# Patient Record
Sex: Female | Born: 1965 | Race: White | Hispanic: No | Smoking: Never smoker
Health system: Southern US, Community
[De-identification: ages and names within clinical notes are randomized; demographics above are authoritative.]

## PROBLEM LIST (undated history)

## (undated) DIAGNOSIS — Q796 Ehlers-Danlos syndrome, unspecified: Secondary | ICD-10-CM

## (undated) DIAGNOSIS — I10 Essential (primary) hypertension: Secondary | ICD-10-CM

## (undated) DIAGNOSIS — D497 Neoplasm of unspecified behavior of endocrine glands and other parts of nervous system: Secondary | ICD-10-CM

## (undated) DIAGNOSIS — M1990 Unspecified osteoarthritis, unspecified site: Secondary | ICD-10-CM

## (undated) DIAGNOSIS — R7303 Prediabetes: Secondary | ICD-10-CM

## (undated) HISTORY — PX: BUNIONECTOMY: SHX129

## (undated) HISTORY — PX: PARATHYROIDECTOMY: SHX19

## (undated) HISTORY — PX: TONSILLECTOMY: SUR1361

---

## 1995-09-21 HISTORY — PX: ABDOMINAL HYSTERECTOMY: SHX81

## 2009-12-12 DIAGNOSIS — Q2381 Bicuspid aortic valve: Secondary | ICD-10-CM | POA: Insufficient documentation

## 2009-12-12 DIAGNOSIS — Q231 Congenital insufficiency of aortic valve: Secondary | ICD-10-CM | POA: Insufficient documentation

## 2009-12-12 DIAGNOSIS — R0609 Other forms of dyspnea: Secondary | ICD-10-CM | POA: Insufficient documentation

## 2014-04-22 DIAGNOSIS — K3184 Gastroparesis: Secondary | ICD-10-CM

## 2014-04-22 DIAGNOSIS — K7581 Nonalcoholic steatohepatitis (NASH): Secondary | ICD-10-CM | POA: Insufficient documentation

## 2014-04-22 DIAGNOSIS — E1143 Type 2 diabetes mellitus with diabetic autonomic (poly)neuropathy: Secondary | ICD-10-CM | POA: Insufficient documentation

## 2014-04-22 DIAGNOSIS — Q796 Ehlers-Danlos syndrome, unspecified: Secondary | ICD-10-CM | POA: Insufficient documentation

## 2014-04-22 DIAGNOSIS — I10 Essential (primary) hypertension: Secondary | ICD-10-CM | POA: Insufficient documentation

## 2014-04-22 DIAGNOSIS — E119 Type 2 diabetes mellitus without complications: Secondary | ICD-10-CM | POA: Insufficient documentation

## 2014-04-22 DIAGNOSIS — M069 Rheumatoid arthritis, unspecified: Secondary | ICD-10-CM | POA: Insufficient documentation

## 2016-10-15 ENCOUNTER — Other Ambulatory Visit: Payer: Self-pay | Admitting: Nurse Practitioner

## 2016-10-15 DIAGNOSIS — Z1231 Encounter for screening mammogram for malignant neoplasm of breast: Secondary | ICD-10-CM

## 2017-01-24 ENCOUNTER — Encounter (HOSPITAL_COMMUNITY): Payer: Self-pay

## 2017-01-24 ENCOUNTER — Ambulatory Visit
Admission: RE | Admit: 2017-01-24 | Discharge: 2017-01-24 | Disposition: A | Discharge status: Home / Self Care | Payer: PRIVATE HEALTH INSURANCE | Source: Ambulatory Visit | Attending: Nurse Practitioner | Admitting: Nurse Practitioner

## 2017-01-24 DIAGNOSIS — Z1231 Encounter for screening mammogram for malignant neoplasm of breast: Secondary | ICD-10-CM | POA: Insufficient documentation

## 2017-01-25 ENCOUNTER — Ambulatory Visit: Payer: Self-pay | Admitting: Podiatry

## 2017-01-28 ENCOUNTER — Other Ambulatory Visit: Payer: Self-pay | Admitting: Nurse Practitioner

## 2017-01-28 DIAGNOSIS — N6489 Other specified disorders of breast: Secondary | ICD-10-CM

## 2017-01-28 DIAGNOSIS — R928 Other abnormal and inconclusive findings on diagnostic imaging of breast: Secondary | ICD-10-CM

## 2017-02-03 ENCOUNTER — Ambulatory Visit
Admission: RE | Admit: 2017-02-03 | Discharge: 2017-02-03 | Disposition: A | Discharge status: Home / Self Care | Payer: Self-pay | Source: Ambulatory Visit | Attending: Nurse Practitioner | Admitting: Nurse Practitioner

## 2017-02-03 ENCOUNTER — Ambulatory Visit
Admission: RE | Admit: 2017-02-03 | Discharge: 2017-02-03 | Disposition: A | Discharge status: Home / Self Care | Payer: PRIVATE HEALTH INSURANCE | Source: Ambulatory Visit | Attending: Nurse Practitioner | Admitting: Nurse Practitioner

## 2017-02-03 DIAGNOSIS — R928 Other abnormal and inconclusive findings on diagnostic imaging of breast: Secondary | ICD-10-CM

## 2017-02-03 DIAGNOSIS — N6489 Other specified disorders of breast: Secondary | ICD-10-CM

## 2017-04-27 ENCOUNTER — Encounter: Payer: Self-pay | Admitting: Emergency Medicine

## 2017-04-27 ENCOUNTER — Other Ambulatory Visit: Payer: Self-pay

## 2017-04-27 ENCOUNTER — Emergency Department: Payer: Self-pay

## 2017-04-27 DIAGNOSIS — R262 Difficulty in walking, not elsewhere classified: Secondary | ICD-10-CM | POA: Insufficient documentation

## 2017-04-27 DIAGNOSIS — R29810 Facial weakness: Secondary | ICD-10-CM | POA: Insufficient documentation

## 2017-04-27 DIAGNOSIS — R531 Weakness: Secondary | ICD-10-CM | POA: Insufficient documentation

## 2017-04-27 DIAGNOSIS — Z8739 Personal history of other diseases of the musculoskeletal system and connective tissue: Secondary | ICD-10-CM | POA: Insufficient documentation

## 2017-04-27 DIAGNOSIS — R202 Paresthesia of skin: Secondary | ICD-10-CM | POA: Insufficient documentation

## 2017-04-27 LAB — DIFFERENTIAL
Basophils Absolute: 0 10*3/uL (ref 0–0.1)
Basophils Relative: 0 %
EOS PCT: 0 %
Eosinophils Absolute: 0 10*3/uL (ref 0–0.7)
LYMPHS PCT: 30 %
Lymphs Abs: 2.5 10*3/uL (ref 1.0–3.6)
MONO ABS: 0.6 10*3/uL (ref 0.2–0.9)
Monocytes Relative: 7 %
NEUTROS ABS: 5.3 10*3/uL (ref 1.4–6.5)
Neutrophils Relative %: 63 %

## 2017-04-27 LAB — CBC
HEMATOCRIT: 44.1 % (ref 35.0–47.0)
Hemoglobin: 15 g/dL (ref 12.0–16.0)
MCH: 31.6 pg (ref 26.0–34.0)
MCHC: 34.1 g/dL (ref 32.0–36.0)
MCV: 92.7 fL (ref 80.0–100.0)
PLATELETS: 241 10*3/uL (ref 150–440)
RBC: 4.75 MIL/uL (ref 3.80–5.20)
RDW: 12.5 % (ref 11.5–14.5)
WBC: 8.4 10*3/uL (ref 3.6–11.0)

## 2017-04-27 LAB — COMPREHENSIVE METABOLIC PANEL
ALK PHOS: 54 U/L (ref 38–126)
ALT: 15 U/L (ref 14–54)
ANION GAP: 8 (ref 5–15)
AST: 24 U/L (ref 15–41)
Albumin: 4.6 g/dL (ref 3.5–5.0)
BUN: 27 mg/dL — ABNORMAL HIGH (ref 6–20)
CALCIUM: 9.6 mg/dL (ref 8.9–10.3)
CO2: 26 mmol/L (ref 22–32)
CREATININE: 1.01 mg/dL — AB (ref 0.44–1.00)
Chloride: 106 mmol/L (ref 101–111)
Glucose, Bld: 109 mg/dL — ABNORMAL HIGH (ref 65–99)
Potassium: 3.8 mmol/L (ref 3.5–5.1)
SODIUM: 140 mmol/L (ref 135–145)
TOTAL PROTEIN: 7.7 g/dL (ref 6.5–8.1)
Total Bilirubin: 0.8 mg/dL (ref 0.3–1.2)

## 2017-04-27 LAB — APTT: aPTT: 28 seconds (ref 24–36)

## 2017-04-27 LAB — TROPONIN I

## 2017-04-27 LAB — PROTIME-INR
INR: 0.95
PROTHROMBIN TIME: 12.7 s (ref 11.4–15.2)

## 2017-04-27 NOTE — ED Triage Notes (Signed)
Pt ambulatory to triage with steady gait, no distress noted. Pt reports having left sided facial droop x1 week ago, symptom went away within hours. Today pt reports having weakness in left and right legs and feet. Pt seen at PCP and referred to pediatrist due to feet "slapping the floor, per pt." No drift noted to arms or legs, no facial droop or slurred speech noted.

## 2017-04-27 NOTE — ED Notes (Signed)
Patient transported to CT 

## 2017-04-28 ENCOUNTER — Emergency Department
Admission: EM | Admit: 2017-04-28 | Discharge: 2017-04-28 | Disposition: A | Discharge status: Home / Self Care | Payer: Self-pay | Attending: Student in an Organized Health Care Education/Training Program | Admitting: Student in an Organized Health Care Education/Training Program

## 2017-04-28 ENCOUNTER — Emergency Department: Payer: Self-pay

## 2017-04-28 DIAGNOSIS — R202 Paresthesia of skin: Secondary | ICD-10-CM

## 2017-04-28 DIAGNOSIS — R531 Weakness: Secondary | ICD-10-CM

## 2017-04-28 LAB — VITAMIN B12: VITAMIN B 12: 425 pg/mL (ref 180–914)

## 2017-04-28 LAB — FOLATE: FOLATE: 9 ng/mL (ref >5.9)

## 2017-04-28 MED ORDER — GADOBENATE DIMEGLUMINE 529 MG/ML IV SOLN
12.0000 mL | Freq: Once | INTRAVENOUS | Status: AC | PRN
  Administered 2017-04-28: 15 mL via INTRAVENOUS

## 2017-04-28 NOTE — ED Notes (Signed)
Pt reports recent stress involving daughter that she feels could be the cause of symptoms, but is concerned about the possibility of a stroke due to information she read regarding symptoms.  Pt is A&Ox4, no difficulty with speech.  No droop noted to one side of mouth at this time.

## 2017-04-28 NOTE — Discharge Instructions (Signed)
Return immediately should you develop any worsening symptoms, weakness, fevers, or for any concerns.

## 2017-04-28 NOTE — ED Provider Notes (Signed)
[facility] Emergency Department Provider Note    First MD Initiated Contact with Patient 04/28/17 0126     (approximate)  I have reviewed the triage vital signs and the nursing notes.   HISTORY  Chief Complaint Extremity Weakness    HPI Anna Cross is a 51 y.o. female presents with chief complaint ofdifficulty walking. States that roughly a week ago while she was at home she did notice some drooping of her left face that would last for several minutes and then resolved. It only involved the lower part of her face. She thought nothing of it but then over the past day or 2 has n worsening weakness in her lower extremity right greater than left and difficulty walking. States that she's having trouble picking up her feet due to weakness. Denies any recent fevers. No recent trauma. She does not smoke. Does have a h/ OF Ehlers-Danlos syndrome.   History reviewed. No pertinent past medical history. Family History  Problem Relation Age of Onset  . Breast cancer Neg Hx    History reviewed. No pertinent surgical history. There are no active problems to display for this patient.     Prior to Admission medications   Not on File    Allergies Patient has no known allergies.    Social History Social History  Substance Use Topics  . Smoking status: Never Smoker  . Smokeless tobacco: Never Used  . Alcohol use No    Review of Systems Patient denies headaches, rhinorrhea, blurry vision, numbness, shortness of breath, chest pain, edema, cough, abdominal pain, nausea, vomiting, diarrhea, dysuria, fevers, rashes or hallucinations unless otherwise stated above in HPI. ____________________________________________   PHYSICAL EXAM:  VITAL SIGNS: Vitals:   04/27/17 2229 04/27/17 2237  BP:  (!) 181/107  Pulse: 88   Resp: 18   Temp: 98 F (36.7 C)     Constitutional: Alert and oriented. Well appearing and in no acute distress. Eyes: Conjunctivae are  normal.  Head: Atraumatic. Nose: No congestion/rhinnorhea. Mouth/Throat: Mucous membranes are moist.   Neck: No stridor. Painless ROM.  Cardiovascular: Normal rate, regular rhythm. Grossly normal heart sounds.  Good peripheral circulation. Respiratory: Normal respiratory effort.  No retractions. Lungs CTAB. Gastrointestinal: Soft and nontender. No distention. No abdominal bruits. No CVA tenderness. Genitourinary:  Musculoskeletal: No lower extremity tenderness nor edema.  No joint effusions. Neurologic:  CN- intact.  No facial droop, Normal FNF.  Normal heel to shin.  Sensation intact bilaterally. Normal speech and language. No gross focal neurologic deficits are appreciated. No gait instability but does have slight foot drag on left  Mild decrease strength with plantar flexion R>L Skin:  Skin is warm, dry and intact. No rash noted. Psychiatric: Mood and affect are normal. Speech and behavior are normal.  ____________________________________________   LABS (all labs ordered are listed, but only abnormal results are displayed)  Results for orders placed or performed during the hospital encounter of 04/28/17 (from the past 24 hour(s))  Protime-INR     Status: None   Collection Time: 04/27/17 10:30 PM  Result Value Ref Range   Prothrombin Time 12.7 11.4 - 15.2 seconds   INR 0.95   APTT     Status: None   Collection Time: 04/27/17 10:30 PM  Result Value Ref Range   aPTT 28 24 - 36 seconds  CBC     Status: None   Collection Time: 04/27/17 10:30 PM  Result Value Ref Range   WBC 8.4 3.6 - 11.0  K/uL   RBC 4.75 3.80 - 5.20 MIL/uL   Hemoglobin 15.0 12.0 - 16.0 g/dL   HCT 44.1 35.0 - 47.0 %   MCV 92.7 80.0 - 100.0 fL   MCH 31.6 26.0 - 34.0 pg   MCHC 34.1 32.0 - 36.0 g/dL   RDW 12.5 11.5 - 14.5 %   Platelets 241 150 - 440 K/uL  Differential     Status: None   Collection Time: 04/27/17 10:30 PM  Result Value Ref Range   Neutrophils Relative % 63 %   Neutro Abs 5.3 1.4 - 6.5 K/uL    Lymphocytes Relative 30 %   Lymphs Abs 2.5 1.0 - 3.6 K/uL   Monocytes Relative 7 %   Monocytes Absolute 0.6 0.2 - 0.9 K/uL   Eosinophils Relative 0 %   Eosinophils Absolute 0.0 0 - 0.7 K/uL   Basophils Relative 0 %   Basophils Absolute 0.0 0 - 0.1 K/uL  Comprehensive metabolic panel     Status: Abnormal   Collection Time: 04/27/17 10:30 PM  Result Value Ref Range   Sodium 140 135 - 145 mmol/L   Potassium 3.8 3.5 - 5.1 mmol/L   Chloride 106 101 - 111 mmol/L   CO2 26 22 - 32 mmol/L   Glucose, Bld 109 (H) 65 - 99 mg/dL   BUN 27 (H) 6 - 20 mg/dL   Creatinine, Ser 1.01 (H) 0.44 - 1.00 mg/dL   Calcium 9.6 8.9 - 10.3 mg/dL   Total Protein 7.7 6.5 - 8.1 g/dL   Albumin 4.6 3.5 - 5.0 g/dL   AST 24 15 - 41 U/L   ALT 15 14 - 54 U/L   Alkaline Phosphatase 54 38 - 126 U/L   Total Bilirubin 0.8 0.3 - 1.2 mg/dL   GFR calc non Af Amer >60 >60 mL/min   GFR calc Af Amer >60 >60 mL/min   Anion gap 8 5 - 15  Troponin I     Status: None   Collection Time: 04/27/17 10:30 PM  Result Value Ref Range   Troponin I <0.03 <0.03 ng/mL   ____________________________________________  EKG My review and personal interpretation at Time: 22"42   Indication: weakness  Rate: 80  Rhythm: sinus Axis: normal Other: normal intervals, no stemi, no depressions ____________________________________________  RADIOLOGY  I personally reviewed all radiographic images ordered to evaluate for the above acute complaints and reviewed radiology reports and findings.  These findings were personally discussed with the patient.  Please see medical record for radiology report.  ____________________________________________   PROCEDURES  Procedure(s) performed:  Procedures    Critical Care performed: no ____________________________________________   INITIAL IMPRESSION / ASSESSMENT AND PLAN / ED COURSE  Pertinent labs & imaging results that were available during my care of the patient were reviewed by me and  considered in my medical decision making (see chart for details).  DDX: cva, dehydration, acs, anemia, ms, GBS, radiculopathy  Anna Cross is a 51 y.o. who presents to the ED with above-described symptoms. On neuro exam only objective finding is some mild weakness with dorsiflexion. CT without any acute infarct, mass or bleed. Blood work is reassuring. EKG without any dysrhythmia or ischemia. Is not consistent with ACS. Troponin is negative.Patient is able to ambulate, but given her history with symptom of facial droop last week and now with reported difficulty walking, MRI ordered due to concern for possible demyelinating disease. MRI shows no evidence of CVA or MS.Based on the intermittent symptoms do not feel this  is clinically consistent with GBS. After further discussion with patient she does enhat the symptoms arose after she gt into an argument with her boyfriend and police contacted her regarding reopening unsolved case of her daughter's murder last year. At this point giv symptoms I did offer admission to the hospital for further workup with the patient has declined is prefering to be further worked up as an outpatient. I do believe that's reasonable given her symptoms.  Have discussed with the patient and available family all diagnostics and treatments performed thus far and all questions were answered to the best of my ability. The patient demonstrates understanding and agreement with plan.       ____________________________________________   FINAL CLINICAL IMPRESSION(S) / ED DIAGNOSES  Final diagnoses:  Paresthesias  Weakness      NEW MEDICATIONS STARTED DURING THIS VISIT:  New Prescriptions   No medications on file     Note:  This document was prepared using Dragon voice recognition software and may include unintentional dictation errors.    Merlyn Lot, MD 04/28/17 [phone]

## 2017-05-03 ENCOUNTER — Ambulatory Visit: Payer: Self-pay | Admitting: Podiatry

## 2017-05-03 DIAGNOSIS — E785 Hyperlipidemia, unspecified: Secondary | ICD-10-CM | POA: Insufficient documentation

## 2017-05-03 DIAGNOSIS — I7090 Unspecified atherosclerosis: Secondary | ICD-10-CM | POA: Insufficient documentation

## 2017-05-03 DIAGNOSIS — Z8249 Family history of ischemic heart disease and other diseases of the circulatory system: Secondary | ICD-10-CM | POA: Insufficient documentation

## 2017-05-11 DIAGNOSIS — R29898 Other symptoms and signs involving the musculoskeletal system: Secondary | ICD-10-CM | POA: Insufficient documentation

## 2017-05-13 ENCOUNTER — Other Ambulatory Visit: Payer: Self-pay | Admitting: Neurology

## 2017-05-13 DIAGNOSIS — G610 Guillain-Barre syndrome: Secondary | ICD-10-CM

## 2017-05-20 ENCOUNTER — Ambulatory Visit
Admission: RE | Admit: 2017-05-20 | Discharge: 2017-05-20 | Disposition: A | Discharge status: Home / Self Care | Payer: PRIVATE HEALTH INSURANCE | Source: Ambulatory Visit | Attending: Neurology | Admitting: Neurology

## 2017-05-20 DIAGNOSIS — R29898 Other symptoms and signs involving the musculoskeletal system: Secondary | ICD-10-CM | POA: Insufficient documentation

## 2017-05-20 DIAGNOSIS — G610 Guillain-Barre syndrome: Secondary | ICD-10-CM

## 2017-05-20 HISTORY — DX: Prediabetes: R73.03

## 2017-05-20 HISTORY — DX: Essential (primary) hypertension: I10

## 2017-05-20 HISTORY — DX: Ehlers-Danlos syndrome, unspecified: Q79.60

## 2017-05-20 HISTORY — DX: Neoplasm of unspecified behavior of endocrine glands and other parts of nervous system: D49.7

## 2017-05-20 HISTORY — DX: Unspecified osteoarthritis, unspecified site: M19.90

## 2017-05-20 LAB — CSF CELL COUNT WITH DIFFERENTIAL
Eosinophils, CSF: 0 %
LYMPHS CSF: 94 %
Monocyte-Macrophage-Spinal Fluid: 6 %
Other Cells, CSF: 0
RBC COUNT CSF: 67 /mm3 — AB (ref 0–3)
SEGMENTED NEUTROPHILS-CSF: 0 %
Tube #: 4
WBC, CSF: 5 /mm3 (ref 0–5)

## 2017-05-20 LAB — PROTEIN, CSF: Total  Protein, CSF: 46 mg/dL — ABNORMAL HIGH (ref 15–45)

## 2017-05-20 LAB — GLUCOSE, CSF: Glucose, CSF: 58 mg/dL (ref 40–70)

## 2017-05-20 LAB — ALBUMIN: Albumin: 4.3 g/dL (ref 3.5–5.0)

## 2017-05-20 MED ORDER — LIDOCAINE HCL (PF) 1 % IJ SOLN
2.0000 mL | Freq: Once | INTRAMUSCULAR | Status: AC
  Administered 2017-05-20: 2 mL
  Filled 2017-05-20: qty 2

## 2017-05-20 NOTE — Progress Notes (Signed)
Patient ID: Anna Cross, female   DOB: [birth year], 51 y.o.   MRN: [id number] Pt stable.back stable.f/u with her m. d.

## 2017-05-20 NOTE — Discharge Instructions (Signed)
Lumbar Puncture, Care After Refer to this sheet in the next few weeks. These instructions provide you with information on caring for yourself after your procedure. Your health care provider may also give you more specific instructions. Your treatment has been planned according to current medical practices, but problems sometimes occur. Call your health care provider if you have any problems or questions after your procedure. What can I expect after the procedure? After your procedure, it is typical to have the following sensations:  Mild discomfort or pain at the insertion site.  Mild headache that is relieved with pain medicines.  Follow these instructions at home:   Avoid lifting anything heavier than 10 lb (4.5 kg) for at least 12 hours after the procedure.  Drink enough fluids to keep your urine clear or pale yellow. Contact a health care provider if:  You have fever or chills.  You have nausea or vomiting.  You have a headache that lasts for more than 2 days. Get help right away if:  You have any numbness or tingling in your legs.  You are unable to control your bowel or bladder.  You have bleeding or swelling in your back at the insertion site.  You are dizzy or faint. This information is not intended to replace advice given to you by your health care provider. Make sure you discuss any questions you have with your health care provider. Document Released: 09/11/2013 Document Revised: 02/12/2016 Document Reviewed: 05/15/2013 Elsevier Interactive Patient Education  2017 [employer].

## 2017-05-24 LAB — IGG CSF INDEX
ALBUMIN CSF-MCNC: 33 mg/dL (ref 11–48)
Albumin: 4.6 g/dL (ref 3.5–5.5)
CSF IgG Index: 0.4 (ref 0.0–0.7)
IGG CSF: 3.6 mg/dL (ref 0.0–8.6)
IgG (Immunoglobin G), Serum: 1128 mg/dL (ref 700–1600)
IgG/Alb Ratio, CSF: 0.11 (ref 0.00–0.25)

## 2017-05-24 LAB — OLIGOCLONAL BANDS, CSF + SERM

## 2017-05-25 LAB — IGG 4: IGG 4: 13 mg/dL (ref 2–96)

## 2017-06-07 DIAGNOSIS — R5383 Other fatigue: Secondary | ICD-10-CM | POA: Insufficient documentation

## 2017-06-07 DIAGNOSIS — Q670 Congenital facial asymmetry: Secondary | ICD-10-CM | POA: Insufficient documentation

## 2017-08-17 ENCOUNTER — Other Ambulatory Visit: Payer: Self-pay | Admitting: Physical Medicine and Rehabilitation

## 2017-08-17 DIAGNOSIS — M5416 Radiculopathy, lumbar region: Secondary | ICD-10-CM

## 2017-08-24 ENCOUNTER — Ambulatory Visit
Admission: RE | Admit: 2017-08-24 | Discharge: 2017-08-24 | Disposition: A | Discharge status: Home / Self Care | Payer: BLUE CROSS/BLUE SHIELD | Source: Ambulatory Visit | Attending: Physical Medicine and Rehabilitation | Admitting: Physical Medicine and Rehabilitation

## 2017-08-24 DIAGNOSIS — M5126 Other intervertebral disc displacement, lumbar region: Secondary | ICD-10-CM | POA: Insufficient documentation

## 2017-08-24 DIAGNOSIS — M5416 Radiculopathy, lumbar region: Secondary | ICD-10-CM | POA: Diagnosis present

## 2017-08-24 DIAGNOSIS — M48061 Spinal stenosis, lumbar region without neurogenic claudication: Secondary | ICD-10-CM | POA: Insufficient documentation

## 2018-01-16 ENCOUNTER — Emergency Department: Payer: BLUE CROSS/BLUE SHIELD

## 2018-01-16 ENCOUNTER — Other Ambulatory Visit: Payer: Self-pay

## 2018-01-16 ENCOUNTER — Emergency Department
Admission: EM | Admit: 2018-01-16 | Discharge: 2018-01-16 | Disposition: A | Discharge status: Home / Self Care | Payer: BLUE CROSS/BLUE SHIELD | Attending: Emergency Medicine | Admitting: Emergency Medicine

## 2018-01-16 ENCOUNTER — Encounter: Payer: Self-pay | Admitting: Emergency Medicine

## 2018-01-16 DIAGNOSIS — L03116 Cellulitis of left lower limb: Secondary | ICD-10-CM | POA: Insufficient documentation

## 2018-01-16 DIAGNOSIS — S93402A Sprain of unspecified ligament of left ankle, initial encounter: Secondary | ICD-10-CM

## 2018-01-16 DIAGNOSIS — W208XXA Other cause of strike by thrown, projected or falling object, initial encounter: Secondary | ICD-10-CM | POA: Insufficient documentation

## 2018-01-16 DIAGNOSIS — I10 Essential (primary) hypertension: Secondary | ICD-10-CM | POA: Insufficient documentation

## 2018-01-16 DIAGNOSIS — Y929 Unspecified place or not applicable: Secondary | ICD-10-CM | POA: Diagnosis not present

## 2018-01-16 DIAGNOSIS — Y939 Activity, unspecified: Secondary | ICD-10-CM | POA: Insufficient documentation

## 2018-01-16 DIAGNOSIS — E119 Type 2 diabetes mellitus without complications: Secondary | ICD-10-CM | POA: Diagnosis not present

## 2018-01-16 DIAGNOSIS — Z7984 Long term (current) use of oral hypoglycemic drugs: Secondary | ICD-10-CM | POA: Insufficient documentation

## 2018-01-16 DIAGNOSIS — Y999 Unspecified external cause status: Secondary | ICD-10-CM | POA: Insufficient documentation

## 2018-01-16 DIAGNOSIS — S99912A Unspecified injury of left ankle, initial encounter: Secondary | ICD-10-CM | POA: Diagnosis present

## 2018-01-16 MED ORDER — HYDROCODONE-ACETAMINOPHEN 5-325 MG PO TABS: 1.0000 | ORAL_TABLET | Freq: Four times a day (QID) | ORAL | 0 refills | Status: DC | PRN

## 2018-01-16 MED ORDER — CLINDAMYCIN HCL 300 MG PO CAPS: 300.0000 mg | ORAL_CAPSULE | Freq: Three times a day (TID) | ORAL | 0 refills | Status: DC

## 2018-01-16 NOTE — ED Provider Notes (Signed)
[name] Emergency Department Provider Note  ____________________________________________   First MD Initiated Contact with Patient 01/16/18 1155     (approximate)  I have reviewed the triage vital signs and the nursing notes.   HISTORY  Chief Complaint Ankle Pain    HPI Anna Cross is a 52 y.o. female presents emergency department complaining of left ankle pain that started yesterday after a large tree limb hit her in the ankle.  She states it was mildly swollen yesterday but is gotten worse today.  She is concerned about the redness and the warmth that is involved.  She was to make sure she does not have a fracture.  She denies any other issues at this time.  Past Medical History:  Diagnosis Date  . Arthritis   . Ehlers-Danlos disease   . Hypertension   . Parathyroid tumor   . Pre-diabetes     Patient Active Problem List   Diagnosis Date Noted  . Atherosclerosis 05/03/2017  . Dyslipidemia 05/03/2017  . FH: heart disease 05/03/2017  . DM (diabetes mellitus) ([place]) 04/22/2014  . Ehlers-Danlos syndrome 04/22/2014  . Gastroparesis due to DM ([place]) 04/22/2014  . HTN (hypertension) 04/22/2014  . NASH (nonalcoholic steatohepatitis) 04/22/2014  . Rheumatoid arthritis ([place]) 04/22/2014  . Bicuspid aortic valve 12/12/2009  . Other dyspnea and respiratory abnormality 12/12/2009    Past Surgical History:  Procedure Laterality Date  . ABDOMINAL HYSTERECTOMY    . BUNIONECTOMY    . PARATHYROIDECTOMY    . TONSILLECTOMY      Prior to Admission medications   Medication Sig Start Date End Date Taking? Authorizing Provider  amitriptyline (ELAVIL) 75 MG tablet Take by mouth.    [provider]  clindamycin (CLEOCIN) 300 MG capsule Take 1 capsule (300 mg total) by mouth 3 (three) times daily. 01/16/18   Carmine Carrozza, Linden Dolin, PA-C  etanercept (ENBREL) 50 MG/ML injection Inject into the skin. 12/12/09   [provider]  glipiZIDE (GLUCOTROL) 5  MG tablet Take by mouth.    [provider]  HYDROcodone-acetaminophen (NORCO/VICODIN) 5-325 MG tablet Take 1 tablet by mouth every 6 (six) hours as needed for moderate pain. 01/16/18   Gibril Mastro, Linden Dolin, PA-C  lisinopril (PRINIVIL,ZESTRIL) 10 MG tablet TAKE ONE TABLET BY MOUTH ONE TIME DAILY 11/04/11   [provider]  metFORMIN (GLUCOPHAGE) 1000 MG tablet Take by mouth.    [provider]  metoprolol succinate (TOPROL-XL) 50 MG 24 hr tablet TAKE ONE AND ONE-HALF TABLETS BY MOUTH ONCE DAILY 08/21/11   [provider]  pantoprazole (PROTONIX) 40 MG tablet Take by mouth.    [provider]  riTUXimab [phone] (RITUXAN) 10 MG/ML injection Inject into the vein.    [provider]  rosuvastatin (CRESTOR) 5 MG tablet TAKE ONE TABLET BY MOUTH AT BEDTIME 07/19/11   [provider]  traZODone (DESYREL) 100 MG tablet Take 100 mg by mouth at bedtime.    [provider]  vitamin E 400 UNIT capsule Take by mouth.    [provider]    Allergies Penicillins  Family History  Problem Relation Age of Onset  . Breast cancer Neg Hx     Social History Social History   Tobacco Use  . Smoking status: Never Smoker  . Smokeless tobacco: Never Used  Substance Use Topics  . Alcohol use: No  . Drug use: No    Review of Systems  Constitutional: No fever/chills Eyes: No visual changes. ENT: No sore throat. Respiratory:  Denies cough Genitourinary: Negative for dysuria. Musculoskeletal: Negative for back pain.  Positive for left ankle pain Skin: Negative for rash.    ____________________________________________   PHYSICAL EXAM:  VITAL SIGNS: ED Triage Vitals  Enc Vitals Group     BP 01/16/18 1124 (!) 141/91     Pulse Rate 01/16/18 1124 62     Resp 01/16/18 1124 17     Temp 01/16/18 1124 97.6 F (36.4 C)     Temp Source 01/16/18 1124 Oral     SpO2 01/16/18 1124 100 %     Weight 01/16/18 1122 138 lb (62.6 kg)      Height 01/16/18 1122 5' 4"  (1.626 m)     Head Circumference --      Peak Flow --      Pain Score 01/16/18 1122 6     Pain Loc --      Pain Edu? --      Excl. in [place]? --     Constitutional: Alert and oriented. Well appearing and in no acute distress. Eyes: Conjunctivae are normal.  Head: Atraumatic. Nose: No congestion/rhinnorhea. Mouth/Throat: Mucous membranes are moist.   Cardiovascular: Normal rate, regular rhythm. Respiratory: Normal respiratory effort.  No retractions GU: deferred Musculoskeletal: FROM all extremities, warm and well perfused.  The left ankle has some redness and swelling.  The left foot is tender to palpation.  She is neurovascularly intact. Neurologic:  Normal speech and language.  Skin:  Skin is warm, dry and intact. No rash noted. Psychiatric: Mood and affect are normal. Speech and behavior are normal.  ____________________________________________   LABS (all labs ordered are listed, but only abnormal results are displayed)  Labs Reviewed - No data to display ____________________________________________   ____________________________________________  RADIOLOGY  Tray of the left ankle is negative for any acute fracture.  ____________________________________________   PROCEDURES  Procedure(s) performed: Ace wrap stirrup splint were applied by tech  Procedures    ____________________________________________   INITIAL IMPRESSION / ASSESSMENT AND PLAN / ED COURSE  Pertinent labs & imaging results that were available during my care of the patient were reviewed by me and considered in my medical decision making (see chart for details).  Patient is 52 year old female presented emergency department after a injury to the left ankle yesterday.  States that ankle was hit by a large tree limb.  She states area has been red and swollen.  On physical exam the left ankle is tender to palpation.  There is a large amount of cellulitis surrounding the  ankle and a small abrasion.  X-ray of the left ankle is negative for any acute fractures.  Results were discussed with patient.  She was given an Ace wrap and stirrup splint.  She was given an antibiotic to prevent infection.  She is to follow-up with her regular doctor or orthopedics if not better in 5 to 7 days.  If the redness is worsening she should return to the emergency department for further evaluation.  She states she understands will comply with her treatment plan.  She was discharged in stable condition     As part of my medical decision making, I reviewed the following data within the [place] notes reviewed and incorporated, Old chart reviewed, Radiograph reviewed x-ray left ankle is negative for fracture, Notes from prior ED visits and [place] Controlled Substance Database  ____________________________________________   FINAL CLINICAL IMPRESSION(S) / ED DIAGNOSES  Final diagnoses:  Sprain of left ankle, unspecified ligament, initial encounter  Cellulitis of left lower extremity      NEW MEDICATIONS STARTED DURING THIS VISIT:  Discharge Medication List as of 01/16/2018 12:22 PM    START taking these medications   Details  clindamycin (CLEOCIN) 300 MG capsule Take 1 capsule (300 mg total) by mouth 3 (three) times daily., Starting Mon 01/16/2018, Print         Note:  This document was prepared using Dragon voice recognition software and may include unintentional dictation errors.    Versie Starks, PA-C 01/16/18 1401    Lavonia Drafts, MD 01/16/18 1438

## 2018-01-16 NOTE — ED Triage Notes (Signed)
Injured left ankle yesterday.  While cutting a tree, a limb of tree swung back and hit ankle.  C/O pain.

## 2018-01-16 NOTE — ED Notes (Signed)
See triage note   States she was hit to left lower leg/ankle area yesterday by large tree limb  Positive swelling with some redness   Good pulses

## 2018-01-16 NOTE — Discharge Instructions (Addendum)
With your regular doctor if not better in 3 to 5 days.  If the redness and swelling is increasing please return emergency department.

## 2018-10-09 ENCOUNTER — Encounter: Payer: Self-pay | Admitting: Family Medicine

## 2018-10-09 ENCOUNTER — Ambulatory Visit: Payer: PRIVATE HEALTH INSURANCE | Admitting: Family Medicine

## 2018-10-09 VITALS — BP 172/88 | HR 95 | Temp 97.7°F | Resp 16 | Ht 64.0 in | Wt 154.0 lb

## 2018-10-09 DIAGNOSIS — Q231 Congenital insufficiency of aortic valve: Secondary | ICD-10-CM

## 2018-10-09 DIAGNOSIS — Z23 Encounter for immunization: Secondary | ICD-10-CM

## 2018-10-09 DIAGNOSIS — K3184 Gastroparesis: Secondary | ICD-10-CM

## 2018-10-09 DIAGNOSIS — E1143 Type 2 diabetes mellitus with diabetic autonomic (poly)neuropathy: Secondary | ICD-10-CM | POA: Diagnosis not present

## 2018-10-09 DIAGNOSIS — E1169 Type 2 diabetes mellitus with other specified complication: Secondary | ICD-10-CM

## 2018-10-09 DIAGNOSIS — Q796 Ehlers-Danlos syndrome, unspecified: Secondary | ICD-10-CM | POA: Diagnosis not present

## 2018-10-09 DIAGNOSIS — I7090 Unspecified atherosclerosis: Secondary | ICD-10-CM

## 2018-10-09 DIAGNOSIS — K7581 Nonalcoholic steatohepatitis (NASH): Secondary | ICD-10-CM

## 2018-10-09 DIAGNOSIS — Z8249 Family history of ischemic heart disease and other diseases of the circulatory system: Secondary | ICD-10-CM

## 2018-10-09 DIAGNOSIS — Z114 Encounter for screening for human immunodeficiency virus [HIV]: Secondary | ICD-10-CM

## 2018-10-09 DIAGNOSIS — M069 Rheumatoid arthritis, unspecified: Secondary | ICD-10-CM | POA: Diagnosis not present

## 2018-10-09 DIAGNOSIS — F4310 Post-traumatic stress disorder, unspecified: Secondary | ICD-10-CM

## 2018-10-09 DIAGNOSIS — Z1159 Encounter for screening for other viral diseases: Secondary | ICD-10-CM

## 2018-10-09 DIAGNOSIS — I10 Essential (primary) hypertension: Secondary | ICD-10-CM

## 2018-10-09 DIAGNOSIS — E785 Hyperlipidemia, unspecified: Secondary | ICD-10-CM

## 2018-10-09 MED ORDER — ESCITALOPRAM OXALATE 5 MG PO TABS: 5.0000 mg | ORAL_TABLET | Freq: Every day | ORAL | 1 refills | Status: DC

## 2018-10-09 MED ORDER — LISINOPRIL 20 MG PO TABS: 20.0000 mg | ORAL_TABLET | Freq: Every day | ORAL | 0 refills | Status: DC

## 2018-10-09 NOTE — Progress Notes (Signed)
Name: Anna Cross   MRN: [id number]    DOB: 12/28/1965   Date:10/09/2018       Progress Note  Subjective  Chief Complaint  Chief Complaint  Patient presents with  . Establish Care  . Hypertension  . Diabetes    Diet controlled    HPI  Pt presents to establish care and for the following:  Social: She was out of insurance for several months when switching jobs.  She stopped all medications except for lisinopril.   Ehlers-Danlos: She was diagnosed along with her 3 children after her daugher was having repetitive dislocations.  Daughter was diagnosed at [facility]. 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  She would like to be connected with a specialist - has seen neurologist with [facility] for nerve conduction studies in the past, but this was not sub-specialist.  She does not hx "compressed disc".  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  Does endorse ongoing physical pain, has hx diagnosis of arthritis - thinks it was RA, but wonders if also related to Ehlers-Danlos.  She did do a lot of RA treatment for several years and none of it helped. Records review shows that she was seeing Dr. Melrose Nakayama in the past.   DM: Has been diet controlled.  Has history of gastroparesis and NAFLD. Does not eat processed foods, walks at work, wants to get into something more intense if she can find time. She has been out of her lisinopril for several months. Not taking Crestor  HTN: She has hx HTN, out of lisinopril for several months.  She did have BP checked by her nurse at work last week and was elevated at 180/90's.  Today she is also elevated.  Was on 80m, but we will increase to 262mLisinopril today.  She does not want to come back in 2 weeks for BP check - will message BP after she has the nurse at work check it.  She denies abnormal headaches, vision changes, condusion, slurred speech, extremity weakness, facial droop.   HLD/Heart Disease/Atherosclerosis/Bicuspid Aortic  Valve: Mom died at age 889yorom MI; Dad had heart disease, sister passed away from cirrhosis (ETOH Abuse).  Has not been taking crestor.  She was having palpitations in the past and had echo done - found bicuspid aortic valve - grandmother also had it.  We will check labs. She denies chest pain, shortness of breath, or palpitations.   PTSD/Depression: Daughter died in 2003-07-2016 She has tried counseling in the past and did not work well for her.  Has trouble sleeping - trazodone made her too groggy.  The anniversary of her daughter's death is upcoming in Fe03-06-2023 She notes some lack of motivation and feeling down lately.  We discussed medication options at length - we will trial low dose Lexapro. Denies SI/HI  HM: No longer obtaining pap smears - had cervix removed with hysterectomy.   Patient Active Problem List   Diagnosis Date Noted  . Atherosclerosis 05/03/2017  . Dyslipidemia 05/03/2017  . FH: heart disease 05/03/2017  . DM (diabetes mellitus) ([place]08/11/2013  . Ehlers-Danlos syndrome 04/22/2014  . Gastroparesis due to DM (HCCamdenton08/11/2013  . HTN (hypertension) 04/22/2014  . NASH (nonalcoholic steatohepatitis) 04/22/2014  . Rheumatoid arthritis (HCMan[place]08/11/2013  . Bicuspid aortic valve 12/12/2009  . Other dyspnea and respiratory abnormality 12/12/2009    Past Surgical History:  Procedure Laterality Date  . ABDOMINAL HYSTERECTOMY  1997   Partial  .  BUNIONECTOMY    . PARATHYROIDECTOMY    . TONSILLECTOMY      Family History  Problem Relation Age of Onset  . Heart attack Mother   . Heart disease Father   . Diabetes Mellitus II Sister   . Liver disease Sister   . Hypertension Brother   . Ehlers-Danlos syndrome Daughter   . Ehlers-Danlos syndrome Daughter   . Ehlers-Danlos syndrome Daughter   . Breast cancer Neg Hx     Social History   Socioeconomic History  . Marital status: Divorced    Spouse name: Not on file  . Number of children: 4  . Years of education: Not on  file  . Highest education level: Some college, no degree  Occupational History  . Not on file  Social Needs  . Financial resource strain: Not hard at all  . Food insecurity:    Worry: Never true    Inability: Never true  . Transportation needs:    Medical: No    Non-medical: No  Tobacco Use  . Smoking status: Never Smoker  . Smokeless tobacco: Never Used  Substance and Sexual Activity  . Alcohol use: Yes    Alcohol/week: 2.0 standard drinks    Types: 2 Cans of beer per week  . Drug use: No  . Sexual activity: Yes    Partners: Male    Birth control/protection: Surgical  Lifestyle  . Physical activity:    Days per week: 6 days    Minutes per session: 30 min  . Stress: Very much  Relationships  . Social connections:    Talks on phone: More than three times a week    Gets together: Once a week    Attends religious service: Never    Active member of club or organization: No    Attends meetings of clubs or organizations: Never    Relationship status: Divorced  . Intimate partner violence:    Fear of current or ex partner: No    Emotionally abused: No    Physically abused: No    Forced sexual activity: No  Other Topics Concern  . Not on file  Social History Narrative  . Not on file     Current Outpatient Medications:  .  lisinopril (PRINIVIL,ZESTRIL) 10 MG tablet, TAKE ONE TABLET BY MOUTH ONE TIME DAILY, Disp: , Rfl:  .  amitriptyline (ELAVIL) 75 MG tablet, Take by mouth., Disp: , Rfl:  .  clindamycin (CLEOCIN) 300 MG capsule, Take 1 capsule (300 mg total) by mouth 3 (three) times daily. (Patient not taking: Reported on 10/09/2018), Disp: 21 capsule, Rfl: 0 .  etanercept (ENBREL) 50 MG/ML injection, Inject into the skin., Disp: , Rfl:  .  glipiZIDE (GLUCOTROL) 5 MG tablet, Take by mouth., Disp: , Rfl:  .  HYDROcodone-acetaminophen (NORCO/VICODIN) 5-325 MG tablet, Take 1 tablet by mouth every 6 (six) hours as needed for moderate pain. (Patient not taking: Reported on  10/09/2018), Disp: 15 tablet, Rfl: 0 .  metFORMIN (GLUCOPHAGE) 1000 MG tablet, Take by mouth., Disp: , Rfl:  .  metoprolol succinate (TOPROL-XL) 50 MG 24 hr tablet, TAKE ONE AND ONE-HALF TABLETS BY MOUTH ONCE DAILY, Disp: , Rfl:  .  pantoprazole (PROTONIX) 40 MG tablet, Take by mouth., Disp: , Rfl:  .  riTUXimab D9242 (RITUXAN) 10 MG/ML injection, Inject into the vein., Disp: , Rfl:  .  rosuvastatin (CRESTOR) 5 MG tablet, TAKE ONE TABLET BY MOUTH AT BEDTIME, Disp: , Rfl:  .  traZODone (DESYREL) 100 MG  tablet, Take 100 mg by mouth at bedtime., Disp: , Rfl:  .  vitamin E 400 UNIT capsule, Take by mouth., Disp: , Rfl:   Allergies  Allergen Reactions  . Penicillins Hives    I personally reviewed active problem list, medication list, allergies, family history, social history, health maintenance, notes from last encounter, lab results with the patient/caregiver today.   ROS Constitutional: Negative for fever or weight change.  Respiratory: Negative for cough and shortness of breath.   Cardiovascular: Negative for chest pain or palpitations.  Gastrointestinal: Negative for abdominal pain, no bowel changes.  Musculoskeletal: Negative for gait problem or joint swelling.  Skin: Negative for rash.  Neurological: Negative for dizziness or headache.  No other specific complaints in a complete review of systems (except as listed in HPI above).  Objective  Vitals:   10/09/18 1018  BP: (!) 172/88  Pulse: 95  Resp: 16  Temp: 97.7 F (36.5 C)  TempSrc: Oral  SpO2: 97%  Weight: 154 lb (69.9 kg)  Height: 5' 4"  (1.626 m)   Body mass index is 26.43 kg/m.  Physical Exam  Constitutional: Patient appears well-developed and well-nourished. No distress.  HENT: Head: Normocephalic and atraumatic. Eyes: Conjunctivae and EOM are normal. No scleral icterus. Neck: Normal range of motion. Neck supple. No JVD present. No thyromegaly present.  Cardiovascular: Normal rate, regular rhythm and normal  heart sounds.  No murmur heard. No BLE edema. Pulmonary/Chest: Effort normal and breath sounds normal. No respiratory distress. Abdominal: Soft. Bowel sounds are normal, no distension. There is no tenderness. No masses. Musculoskeletal: Normal range of motion, no joint effusions. No gross deformities Neurological: Pt is alert and oriented to person, place, and time. No cranial nerve deficit. Coordination, balance, strength, speech and gait are normal.  Skin: Skin is warm and dry. No rash noted. No erythema.  Psychiatric: Patient has a normal mood and affect. behavior is normal. Judgment and thought content normal.   No results found for this or any previous visit (from the past 72 hour(s)).   PHQ2/9: Depression screen PHQ 2/9 10/09/2018  Decreased Interest 0  Down, Depressed, Hopeless 0  PHQ - 2 Score 0  Altered sleeping 2  Tired, decreased energy 1  Change in appetite 0  Feeling bad or failure about yourself  0  Trouble concentrating 0  Moving slowly or fidgety/restless 0  Suicidal thoughts 0  PHQ-9 Score 3  Difficult doing work/chores Not difficult at all   Fall Risk: Fall Risk  10/09/2018  Falls in the past year? 0   Functional Status Survey: Is the patient deaf or have difficulty hearing?: No Does the patient have difficulty seeing, even when wearing glasses/contacts?: Yes Does the patient have difficulty concentrating, remembering, or making decisions?: No Does the patient have difficulty walking or climbing stairs?: No Does the patient have difficulty dressing or bathing?: No Does the patient have difficulty doing errands alone such as visiting a doctor's office or shopping?: No  Assessment & Plan 1. Ehlers-Danlos syndrome - We will work to find rheumatologist with subspecialty of Ehlers-Danlos.  2. Rheumatoid arthritis, involving unspecified site, unspecified rheumatoid factor presence ([place])- - We will work to find rheumatologist with subspecialty of  Ehlers-Danlos.  3. Type 2 diabetes mellitus with other specified complication, without long-term current use of insulin (HCC) - lisinopril (PRINIVIL,ZESTRIL) 20 MG tablet; Take 1 tablet (20 mg total) by mouth daily.  Dispense: 90 tablet; Refill: 0 - Lipid panel - Hemoglobin A1c - COMPLETE METABOLIC PANEL WITH GFR -  Urine Microalbumin w/creat. ratio - Pneumococcal polysaccharide vaccine 23-valent greater than or equal to 2yo subcutaneous/IM  4. Gastroparesis due to DM (HCC) - No concerns today  5. Essential hypertension - DASH diet discussed - lisinopril (PRINIVIL,ZESTRIL) 20 MG tablet; Take 1 tablet (20 mg total) by mouth daily.  Dispense: 90 tablet; Refill: 0  6. Dyslipidemia - Lipid panel  7. Atherosclerosis - Lipid Panel  8. Bicuspid aortic valve - Stable  9. NASH (nonalcoholic steatohepatitis) - CMP  10. FH: heart disease  11. PTSD (post-traumatic stress disorder) - Lexapro per orders  12. Encounter for screening for HIV - HIV Antibody (routine testing w rflx)  13. Need for hepatitis C screening test - Hepatitis C antibody

## 2018-10-09 NOTE — Patient Instructions (Addendum)
   [email or web address] therapist finder  Here are some resources to help you if you feel you are in a mental health crisis:  [place] - Call [phone]  for help - Website with more resources: [email or web address].pt  [facility] [facility] - Call [phone] for help. - Mobile Crisis Program available 24 hours a day, 365 days a year. - Available for anyone of any age in [place] counties.  [facility] - Address: [address], [place] - Telephone: [phone]  - Hours of Operation: Sunday - Saturday - 8:00 a.m. - 8:00 p.m. - Medicaid, Medicare (Government Issued Only), BCBS, and [facility] [place] [facility], [place], Psychiatrists on-site to provide medication management, [place], and Peer Support Care.  [facility] - Call [phone] for help. - Mobile Crisis Program available 24 hours a day, 365 days a year. - Available for anyone of any age in [place]

## 2018-10-10 LAB — COMPLETE METABOLIC PANEL WITH GFR
AG RATIO: 1.8 (calc) (ref 1.0–2.5)
ALT: 13 U/L (ref 6–29)
AST: 19 U/L (ref 10–35)
Albumin: 4.7 g/dL (ref 3.6–5.1)
Alkaline phosphatase (APISO): 86 U/L (ref 33–130)
BUN: 20 mg/dL (ref 7–25)
CALCIUM: 9.7 mg/dL (ref 8.6–10.4)
CO2: 29 mmol/L (ref 20–32)
Chloride: 103 mmol/L (ref 98–110)
Creat: 0.87 mg/dL (ref 0.50–1.05)
GFR, EST NON AFRICAN AMERICAN: 76 mL/min/{1.73_m2} (ref >=60)
GFR, Est African American: 88 mL/min/{1.73_m2} (ref >=60)
GLOBULIN: 2.6 g/dL (ref 1.9–3.7)
Glucose, Bld: 94 mg/dL (ref 65–139)
POTASSIUM: 4.3 mmol/L (ref 3.5–5.3)
Sodium: 141 mmol/L (ref 135–146)
TOTAL PROTEIN: 7.3 g/dL (ref 6.1–8.1)
Total Bilirubin: 0.8 mg/dL (ref 0.2–1.2)

## 2018-10-10 LAB — LIPID PANEL
CHOLESTEROL: 213 mg/dL — AB (ref <200)
HDL: 59 mg/dL (ref >50)
LDL Cholesterol (Calc): 128 mg/dL (calc) — ABNORMAL HIGH
NON-HDL CHOLESTEROL (CALC): 154 mg/dL — AB (ref <130)
Total CHOL/HDL Ratio: 3.6 (calc) (ref <5.0)
Triglycerides: 148 mg/dL (ref <150)

## 2018-10-10 LAB — HEPATITIS C ANTIBODY
Hepatitis C Ab: NONREACTIVE
SIGNAL TO CUT-OFF: 0.02 (ref <1.00)

## 2018-10-10 LAB — MICROALBUMIN / CREATININE URINE RATIO
Creatinine, Urine: 204 mg/dL (ref 20–275)
MICROALB/CREAT RATIO: 29 ug/mg{creat} (ref <30)
Microalb, Ur: 6 mg/dL

## 2018-10-10 LAB — HIV ANTIBODY (ROUTINE TESTING W REFLEX): HIV 1&2 Ab, 4th Generation: NONREACTIVE

## 2018-10-11 ENCOUNTER — Other Ambulatory Visit: Payer: Self-pay | Admitting: Family Medicine

## 2018-10-11 MED ORDER — ROSUVASTATIN CALCIUM 10 MG PO TABS: 10.0000 mg | ORAL_TABLET | Freq: Every day | ORAL | 1 refills | Status: DC

## 2018-10-12 ENCOUNTER — Other Ambulatory Visit: Payer: Self-pay | Admitting: Family Medicine

## 2018-10-12 ENCOUNTER — Encounter: Payer: Self-pay | Admitting: Family Medicine

## 2018-10-16 ENCOUNTER — Encounter: Payer: Self-pay | Admitting: Family Medicine

## 2018-10-16 DIAGNOSIS — Z8619 Personal history of other infectious and parasitic diseases: Secondary | ICD-10-CM | POA: Insufficient documentation

## 2018-10-16 DIAGNOSIS — L409 Psoriasis, unspecified: Secondary | ICD-10-CM | POA: Insufficient documentation

## 2018-10-16 DIAGNOSIS — D351 Benign neoplasm of parathyroid gland: Secondary | ICD-10-CM | POA: Insufficient documentation

## 2018-10-18 ENCOUNTER — Encounter: Payer: Self-pay | Admitting: Family Medicine

## 2018-10-19 ENCOUNTER — Encounter: Payer: Self-pay | Admitting: Family Medicine

## 2018-10-19 ENCOUNTER — Telehealth: Payer: Self-pay

## 2018-10-19 DIAGNOSIS — I10 Essential (primary) hypertension: Secondary | ICD-10-CM

## 2018-10-19 LAB — HEMOGLOBIN A1C: Hemoglobin A1C: 4.9

## 2018-10-19 LAB — BASIC METABOLIC PANEL: Glucose: 113

## 2018-10-19 NOTE — Telephone Encounter (Signed)
Has she been compliant with her lisinopril over the last 2 weeks? Any missed doses?

## 2018-10-19 NOTE — Telephone Encounter (Signed)
Patient called to let us know a nurse came by her work and checked her A1C-4.9 and glucose was 113, her BP was 168/98. She states she has not been sleeping well and just a started her on Lisinopril 20 mg about 2 weeks ago. Anna Cross told the patient she wanted her to call with her BP results before coming back in.

## 2018-10-20 NOTE — Telephone Encounter (Signed)
Will route to office for final disposition; pt last seen 10/09/2018 by Raelyn Ensign.

## 2018-10-20 NOTE — Telephone Encounter (Signed)
° ° ° °  Pt return Anna Cross call to say she has been taking her Lisinopril and has not missed any doses

## 2018-10-20 NOTE — Telephone Encounter (Signed)
Left voicemail and asked patient to call us back with information.

## 2018-10-25 MED ORDER — LISINOPRIL 40 MG PO TABS: 40.0000 mg | ORAL_TABLET | Freq: Every day | ORAL | 1 refills | Status: DC

## 2018-10-25 NOTE — Telephone Encounter (Addendum)
Let's increase her dosing to 72m of the lisinopril -  She can take 2 tablets of her 275mlisinopril until she runs out, the start taking 1 tablet once daily of 4015misinopril tablets. She needs to come in for follow up in 2-3 weeks - I believe she has something scheduled on 11/06/18  **Please also let her know that I am still waiting on [facility]'s physician liaison [facility].**

## 2018-10-25 NOTE — Addendum Note (Signed)
Addended by: Hubbard Hartshorn on: 10/25/2018 01:53 PM   Modules accepted: Orders

## 2018-10-27 NOTE — Telephone Encounter (Signed)
Patient notified

## 2018-11-02 ENCOUNTER — Telehealth: Payer: Self-pay | Admitting: Family Medicine

## 2018-11-02 NOTE — Telephone Encounter (Signed)
Please call Anna Cross and let her know that I did submit a referral to the [facility] with [facility], however we received an e-mail today that they are booked out for at least a year for new patients. They recommend referral to pain management with severe pain, and PT/OT if needed for strengthening.  Would she like a referral to either of these services?  Please also ask if she has family history of personal history of vascular EDS or just the hypermobility form.  Thanks!

## 2018-11-02 NOTE — Telephone Encounter (Signed)
Left message for patient to call office.  

## 2018-11-06 ENCOUNTER — Encounter: Payer: Self-pay | Admitting: Family Medicine

## 2018-11-06 ENCOUNTER — Ambulatory Visit: Payer: PRIVATE HEALTH INSURANCE | Admitting: Family Medicine

## 2018-11-06 ENCOUNTER — Other Ambulatory Visit: Payer: Self-pay

## 2018-11-06 VITALS — BP 138/88 | HR 95 | Temp 98.3°F | Resp 16 | Ht 64.0 in | Wt 163.9 lb

## 2018-11-06 DIAGNOSIS — M069 Rheumatoid arthritis, unspecified: Secondary | ICD-10-CM | POA: Diagnosis not present

## 2018-11-06 DIAGNOSIS — Q796 Ehlers-Danlos syndrome, unspecified: Secondary | ICD-10-CM

## 2018-11-06 DIAGNOSIS — F4310 Post-traumatic stress disorder, unspecified: Secondary | ICD-10-CM

## 2018-11-06 DIAGNOSIS — I10 Essential (primary) hypertension: Secondary | ICD-10-CM

## 2018-11-06 MED ORDER — ESCITALOPRAM OXALATE 10 MG PO TABS: 10.0000 mg | ORAL_TABLET | Freq: Every day | ORAL | 0 refills | Status: DC

## 2018-11-06 MED ORDER — HYDROCHLOROTHIAZIDE 12.5 MG PO TABS: 12.5000 mg | ORAL_TABLET | Freq: Every day | ORAL | 3 refills | Status: AC

## 2018-11-06 NOTE — Progress Notes (Signed)
Name: Anna Cross   MRN: [id number]    DOB: 04/07/1966   Date:11/06/2018       Progress Note  Subjective  Chief Complaint  Chief Complaint  Patient presents with  . Hypertension    1 month recheck after increase in medication    HPI  Pt presents for 1 month follow up:  Ehlers-Danlos vs RA: She was diagnosed along with her 3 children after her daugher was having repetitive dislocations.  Daughter was diagnosed at [facility]. 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  She would like to be connected with a specialist - has seen neurologist with [facility] for nerve conduction studies in the past, but this was not sub-specialist.  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  Does endorse ongoing physical pain, has hx diagnosis of arthritis - thinks it was RA, but wonders if also related to Ehlers-Danlos.  She did do a lot of RA treatment for several years and none of it helped. Records review shows that she was seeing Dr. Melrose Nakayama in the past.  We did refer to ED clinic with [facility], but they are booked out over a year.  No hx vascular ED, just hypermobility.  We will refer to rheum today to allow her to be seen faster, and for eval of possible RA.   HTN: Restarted lisinopril at 69m last visit - Nurse checked BP at work and it was in the 160's about a week ago, today she is high end of normal, so we will add HCTZ 12.5 and recheck labs in 2 weeks.  Does follow low sodium diet.  She denies abnormal headaches, vision changes, confusion, slurred speech, extremity weakness, chest pain, or shortness of breath, or BLE edema.  PTSD/Depression: Daughter died in F09-Mar-2017  She has tried counseling in the past and did not work well for her.  Has trouble sleeping - trazodone made her too groggy.   She notes some lack of motivation and feeling down which has improved with Lexapro. Denies SI/HI.  She is doing well on 554m but  thinks a slightly higher dose would benefit more.  We will increase to 1030m Patient Active Problem List   Diagnosis Date Noted  . Psoriasis 10/16/2018  . 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 H/O cold sores 10/16/2018  . PTSD (post-traumatic stress disorder) 10/09/2018  . Fatigue 06/07/2017  . Facial asymmetry 06/07/2017  . Other symptoms and signs involving the musculoskeletal system 05/11/2017  . Atherosclerosis 05/03/2017  . Dyslipidemia 05/03/2017  . FH: heart disease 05/03/2017  . DM (diabetes mellitus) (HCCArtemus8/11/2013  . Ehlers-Danlos syndrome 04/22/2014  . Gastroparesis due to DM (HCCLimon8/11/2013  . HTN (hypertension) 04/22/2014  . NASH (nonalcoholic steatohepatitis) 04/22/2014  . Rheumatoid arthritis (HCCBrun[place]8/11/2013  . Bicuspid aortic valve 12/12/2009  . Other dyspnea and respiratory abnormality 12/12/2009    Past Surgical History:  Procedure Laterality Date  . ABDOMINAL HYSTERECTOMY  1997   Partial  . BUNIONECTOMY    . PARATHYROIDECTOMY    . TONSILLECTOMY      Family History  Problem Relation Age of Onset  . Heart attack Mother   . Heart disease Father   . Diabetes Mellitus II Sister   . Liver disease Sister   . Hypertension Brother   . Ehlers-Danlos syndrome Daughter   . Ehlers-Danlos syndrome Daughter   . Ehlers-Danlos syndrome Daughter   .  Breast cancer Neg Hx     Social History   Socioeconomic History  . Marital status: Divorced    Spouse name: Not on file  . Number of children: 4  . Years of education: Not on file  . Highest education level: Some college, no degree  Occupational History  . Not on file  Social Needs  . Financial resource strain: Not hard at all  . Food insecurity:    Worry: Never true    Inability: Never true  . Transportation needs:    Medical: No    Non-medical: No  Tobacco Use  . Smoking status: Never Smoker  . Smokeless tobacco: Never Used  Substance and Sexual Activity  . Alcohol use: Yes     Alcohol/week: 2.0 standard drinks    Types: 2 Cans of beer per week  . Drug use: No  . Sexual activity: Yes    Partners: Male    Birth control/protection: Surgical  Lifestyle  . Physical activity:    Days per week: 6 days    Minutes per session: 30 min  . Stress: Very much  Relationships  . Social connections:    Talks on phone: More than three times a week    Gets together: Once a week    Attends religious service: Never    Active member of club or organization: No    Attends meetings of clubs or organizations: Never    Relationship status: Divorced  . Intimate partner violence:    Fear of current or ex partner: No    Emotionally abused: No    Physically abused: No    Forced sexual activity: No  Other Topics Concern  . Not on file  Social History Narrative  . Not on file     Current Outpatient Medications:  .  escitalopram (LEXAPRO) 5 MG tablet, , Disp: , Rfl:  .  lisinopril (PRINIVIL,ZESTRIL) 40 MG tablet, Take 1 tablet (40 mg total) by mouth daily., Disp: 30 tablet, Rfl: 1 .  rosuvastatin (CRESTOR) 10 MG tablet, Take 1 tablet (10 mg total) by mouth daily., Disp: 90 tablet, Rfl: 1 .  diphenhydrAMINE (BENADRYL) 50 MG capsule, Take 50 mg by mouth at bedtime., Disp: , Rfl:  .  Melatonin 10 MG TABS, Take 10 mg by mouth at bedtime., Disp: , Rfl:   Allergies  Allergen Reactions  . Penicillins Hives    I personally reviewed active problem list, medication list, allergies, notes from last encounter, lab results with the patient/caregiver today.   ROS  Constitutional: Negative for fever or weight change.  Respiratory: Negative for cough and shortness of breath.   Cardiovascular: Negative for chest pain or palpitations.  Gastrointestinal: Negative for abdominal pain, no bowel changes.  Musculoskeletal: Negative for gait problem or joint swelling.  Skin: Negative for rash.  Neurological: Negative for dizziness or headache.  No other specific complaints in a complete  review of systems (except as listed in HPI above).  Objective  Vitals:   11/06/18 1048  BP: 138/88  Pulse: 95  Resp: 16  Temp: 98.3 F (36.8 C)  TempSrc: Oral  SpO2: 99%  Weight: 163 lb 14.4 oz (74.3 kg)  Height: 5' 4"  (1.626 m)   Body mass index is 28.13 kg/m.  Physical Exam Constitutional: Patient appears well-developed and well-nourished. No distress.  HENT: Head: Normocephalic and atraumatic.  Neck: Normal range of motion. Neck supple. No JVD present. No thyromegaly present.  Cardiovascular: Normal rate, regular rhythm and normal heart sounds.  No murmur heard. No BLE edema. Pulmonary/Chest: Effort normal and breath sounds normal. No respiratory distress. Neurological: Pt is alert and oriented to person, place, and time. No cranial nerve deficit. Coordination, balance, strength, speech and gait are normal.  Skin: Skin is warm and dry. No rash noted. No erythema.  Psychiatric: Patient has a normal mood and affect. behavior is normal. Judgment and thought content normal.  No results found for this or any previous visit (from the past 72 hour(s)).  PHQ2/9: Depression screen [facility] 2/9 11/06/2018 10/09/2018  Decreased Interest 0 0  Down, Depressed, Hopeless 0 0  PHQ - 2 Score 0 0  Altered sleeping 2 2  Tired, decreased energy 0 1  Change in appetite 0 0  Feeling bad or failure about yourself  0 0  Trouble concentrating 0 0  Moving slowly or fidgety/restless 0 0  Suicidal thoughts 0 0  PHQ-9 Score 2 3  Difficult doing work/chores Not difficult at all Not difficult at all   Fall Risk: Fall Risk  11/06/2018 10/09/2018  Falls in the past year? 0 0  Number falls in past yr: 0 -  Injury with Fall? 0 -  Follow up Falls evaluation completed -   Assessment & Plan  1. Essential hypertension - hydrochlorothiazide (HYDRODIURIL) 12.5 MG tablet; Take 1 tablet (12.5 mg total) by mouth daily.  Dispense: 90 tablet; Refill: 3 - BASIC METABOLIC PANEL WITH GFR  2. Rheumatoid  arthritis, involving unspecified site, unspecified rheumatoid factor presence ([place]) - Ambulatory referral to Rheumatology  3. Ehlers-Danlos syndrome - Ambulatory referral to Rheumatology  4. PTSD (post-traumatic stress disorder) - escitalopram (LEXAPRO) 10 MG tablet; Take 1 tablet (10 mg total) by mouth daily.  Dispense: 90 tablet; Refill: 0

## 2018-11-06 NOTE — Patient Instructions (Addendum)
Take 7.59m of Lexapro for about 4-5 days, then increase to 137mtablets.  Have Metabolic Panel drawn in 2 weeks after starting HCTZ.

## 2018-11-21 LAB — BASIC METABOLIC PANEL WITH GFR
BUN/Creatinine Ratio: 25 (calc) — ABNORMAL HIGH (ref 6–22)
BUN: 26 mg/dL — ABNORMAL HIGH (ref 7–25)
CO2: 28 mmol/L (ref 20–32)
Calcium: 9.8 mg/dL (ref 8.6–10.4)
Chloride: 103 mmol/L (ref 98–110)
Creat: 1.05 mg/dL (ref 0.50–1.05)
GFR, Est African American: 70 mL/min/{1.73_m2} (ref >=60)
GFR, Est Non African American: 61 mL/min/{1.73_m2} (ref >=60)
Glucose, Bld: 94 mg/dL (ref 65–99)
Potassium: 3.8 mmol/L (ref 3.5–5.3)
Sodium: 141 mmol/L (ref 135–146)

## 2018-11-27 DIAGNOSIS — R252 Cramp and spasm: Secondary | ICD-10-CM | POA: Insufficient documentation

## 2018-11-27 DIAGNOSIS — M2550 Pain in unspecified joint: Secondary | ICD-10-CM | POA: Insufficient documentation

## 2018-12-09 ENCOUNTER — Other Ambulatory Visit: Payer: Self-pay | Admitting: Family Medicine

## 2018-12-09 DIAGNOSIS — I10 Essential (primary) hypertension: Secondary | ICD-10-CM

## 2019-01-01 ENCOUNTER — Encounter: Payer: PRIVATE HEALTH INSURANCE | Admitting: Family Medicine

## 2019-01-01 ENCOUNTER — Other Ambulatory Visit: Payer: Self-pay

## 2019-01-01 ENCOUNTER — Encounter: Payer: Self-pay | Admitting: Family Medicine

## 2019-01-01 ENCOUNTER — Ambulatory Visit: Payer: PRIVATE HEALTH INSURANCE | Admitting: Family Medicine

## 2019-01-01 VITALS — BP 120/90 | HR 80 | Temp 98.4°F | Resp 16 | Ht 64.0 in | Wt 166.5 lb

## 2019-01-01 DIAGNOSIS — L0291 Cutaneous abscess, unspecified: Secondary | ICD-10-CM | POA: Diagnosis not present

## 2019-01-01 DIAGNOSIS — R1319 Other dysphagia: Secondary | ICD-10-CM

## 2019-01-01 DIAGNOSIS — R1310 Dysphagia, unspecified: Secondary | ICD-10-CM

## 2019-01-01 MED ORDER — DOXYCYCLINE HYCLATE 100 MG PO TABS: 100.0000 mg | ORAL_TABLET | Freq: Two times a day (BID) | ORAL | 0 refills | Status: DC

## 2019-01-01 MED ORDER — MUPIROCIN 2 % EX OINT: 1.0000 "application " | TOPICAL_OINTMENT | Freq: Two times a day (BID) | CUTANEOUS | 0 refills | Status: DC

## 2019-01-01 NOTE — Progress Notes (Signed)
Name: Anna Cross   MRN: [id number]    DOB: 10-13-1965   Date:01/01/2019       Progress Note  Subjective  Chief Complaint  Chief Complaint  Patient presents with  . Cyst    under her arms  . throat tightness    Has been having som tightness in her throat were parathyroid gland was removed along with indigestion.    HPI  Dysphagia: she states symptoms started about 2 years ago, initially dysphagia to liquids but now also to solids and getting progressively worse over the past 6 months. She also has noticed some heartburn sensation, but it has improved with life style modification. She stopped drinking coffee and avoiding spicy food. She denies significant weight changes. No change in bowel movements. She had parathyroidectomy back in 2005 and is concerned that it may be the cause of her symptoms. Explained it is a possibility that scar tissue may be pressing against her esophagus, but may also be esophagitis and or stricture of esophagus itself. She states she can wait until the Fall to see GI and prefers not starting medication at this time  Abscess: she has noticed recurrent nodules that can look like a pimple that gets larger and more painful, or just a nodule under her skin, Mostly on both axillas, but also on upper back, abdomen and arms. Not associated with fever or chills. They seems to resolve on its own but show up on another area a few days later. Her daughter moved in with her recently and does not have any symptoms. No fever or chills. We discussed options and she agrees on being treated for possible MRSA. She will keep cleaning house surfaces with clorox.    Patient Active Problem List   Diagnosis Date Noted  . Leg cramp 11/27/2018  . Polyarthralgia 11/27/2018  . Psoriasis 10/16/2018  . 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 H/O cold sores 10/16/2018  . PTSD (post-traumatic stress disorder) 10/09/2018  . Fatigue 06/07/2017  . Facial asymmetry 06/07/2017  . Other  symptoms and signs involving the musculoskeletal system 05/11/2017  . Atherosclerosis 05/03/2017  . Dyslipidemia 05/03/2017  . FH: heart disease 05/03/2017  . DM (diabetes mellitus) ([place]) 04/22/2014  . Ehlers-Danlos syndrome 04/22/2014  . Gastroparesis due to DM ([place]) 04/22/2014  . HTN (hypertension) 04/22/2014  . NASH (nonalcoholic steatohepatitis) 04/22/2014  . Rheumatoid arthritis ([place]) 04/22/2014  . Bicuspid aortic valve 12/12/2009  . Other dyspnea and respiratory abnormality 12/12/2009    Social History   Tobacco Use  . Smoking status: Never Smoker  . Smokeless tobacco: Never Used  Substance Use Topics  . Alcohol use: Yes    Alcohol/week: 2.0 standard drinks    Types: 2 Cans of beer per week     Current Outpatient Medications:  .  escitalopram (LEXAPRO) 10 MG tablet, Take 1 tablet (10 mg total) by mouth daily., Disp: 90 tablet, Rfl: 0 .  hydrochlorothiazide (HYDRODIURIL) 12.5 MG tablet, Take 1 tablet (12.5 mg total) by mouth daily., Disp: 90 tablet, Rfl: 3 .  lisinopril (PRINIVIL,ZESTRIL) 40 MG tablet, TAKE 1 TABLET BY MOUTH EVERY DAY, Disp: 90 tablet, Rfl: 1 .  rosuvastatin (CRESTOR) 10 MG tablet, Take 1 tablet (10 mg total) by mouth daily., Disp: 90 tablet, Rfl: 1 .  diphenhydrAMINE (BENADRYL) 50 MG capsule, Take 50 mg by mouth at bedtime., Disp: , Rfl:  .  Melatonin 10 MG TABS, Take 10 mg by mouth at bedtime., Disp: , Rfl:   Allergies  Allergen  Reactions  . Penicillins Hives    ROS  Ten systems reviewed and is negative except as mentioned in HPI   Objective  Vitals:   01/01/19 1019  BP: 120/90  Pulse: 80  Resp: 16  Temp: 98.4 F (36.9 C)  TempSrc: Oral  SpO2: 97%  Weight: 166 lb 8 oz (75.5 kg)  Height: 5' 4"  (1.626 m)    Body mass index is 28.58 kg/m.    Physical Exam  Constitutional: Patient appears well-developed and well-nourished. Overweight.  No distress.  HEENT: head atraumatic, normocephalic, pupils equal and reactive to light,  neck  supple, throat within normal limits Cardiovascular: Normal rate, regular rhythm and normal heart sounds.  No murmur heard. No BLE edema. Pulmonary/Chest: Effort normal and breath sounds normal. No respiratory distress. Abdominal: Soft.  There is no tenderness. Skin: two areas of induration and tenderness on both axillas. Also one on her upper back. No oozing, mild redness  Psychiatric: Patient has a normal mood and affect. behavior is normal. Judgment and thought content normal.  Recent Results (from the past 2160 hour(s))  Lipid panel     Status: Abnormal   Collection Time: 10/09/18 11:22 AM  Result Value Ref Range   Cholesterol 213 (H) <200 mg/dL   HDL 59 >50 mg/dL   Triglycerides 148 <150 mg/dL   LDL Cholesterol (Calc) 128 (H) mg/dL (calc)    Comment: Reference range: <100 . Desirable range <100 mg/dL for primary prevention;   <70 mg/dL for patients with CHD or diabetic patients  with > or = 2 CHD risk factors. Marland Kitchen LDL-C is now calculated using the Martin-Hopkins  calculation, which is a validated novel method providing  better accuracy than the Friedewald equation in the  estimation of LDL-C.  Cresenciano Genre et al. Annamaria Helling. 2841;324(40): 2061-2068  (http://education.[facility].com/faq/FAQ164)    Total CHOL/HDL Ratio 3.6 <5.0 (calc)   Non-HDL Cholesterol (Calc) 154 (H) <130 mg/dL (calc)    Comment: For patients with diabetes plus 1 major ASCVD risk  factor, treating to a non-HDL-C goal of <100 mg/dL  (LDL-C of <70 mg/dL) is considered a therapeutic  option.   COMPLETE METABOLIC PANEL WITH GFR     Status: None   Collection Time: 10/09/18 11:22 AM  Result Value Ref Range   Glucose, Bld 94 65 - 139 mg/dL    Comment: .        Non-fasting reference interval .    BUN 20 7 - 25 mg/dL   Creat 0.87 0.50 - 1.05 mg/dL    Comment: For patients >24 years of age, the reference limit for Creatinine is approximately 13% higher for people identified as African-American. .    GFR, Est  Non African American 76 > OR = 60 mL/min/1.92m   GFR, Est African American 88 > OR = 60 mL/min/1.772m  BUN/Creatinine Ratio NOT APPLICABLE 6 - 22 (calc)   Sodium 141 135 - 146 mmol/L   Potassium 4.3 3.5 - 5.3 mmol/L   Chloride 103 98 - 110 mmol/L   CO2 29 20 - 32 mmol/L   Calcium 9.7 8.6 - 10.4 mg/dL   Total Protein 7.3 6.1 - 8.1 g/dL   Albumin 4.7 3.6 - 5.1 g/dL   Globulin 2.6 1.9 - 3.7 g/dL (calc)   AG Ratio 1.8 1.0 - 2.5 (calc)   Total Bilirubin 0.8 0.2 - 1.2 mg/dL   Alkaline phosphatase (APISO) 86 33 - 130 U/L   AST 19 10 - 35 U/L   ALT 13 6 -  29 U/L  Urine Microalbumin w/creat. ratio     Status: None   Collection Time: 10/09/18 11:22 AM  Result Value Ref Range   Creatinine, Urine 204 20 - 275 mg/dL   Microalb, Ur 6.0 mg/dL    Comment: Reference Range Not established    Microalb Creat Ratio 29 <30 mcg/mg creat    Comment: . The ADA defines abnormalities in albumin excretion as follows: Marland Kitchen Category         Result (mcg/mg creatinine) . Normal                    <30 Microalbuminuria         30-299  Clinical albuminuria   > OR = 300 . The ADA recommends that at least two of three specimens collected within a 3-6 month period be abnormal before considering a patient to be within a diagnostic category.   Hepatitis C antibody     Status: None   Collection Time: 10/09/18 11:22 AM  Result Value Ref Range   Hepatitis C Ab NON-REACTIVE NON-REACTI   SIGNAL TO CUT-OFF 0.02 <1.00    Comment: . HCV antibody was non-reactive. There is no laboratory  evidence of HCV infection. . In most cases, no further action is required. However, if recent HCV exposure is suspected, a test for HCV RNA (test code 8195618979) is suggested. . For additional information please refer to http://education.[facility].com/faq/FAQ22v1 (This link is being provided for informational/ educational purposes only.) .   HIV Antibody (routine testing w rflx)     Status: None   Collection Time:  10/09/18 11:22 AM  Result Value Ref Range   HIV 1&2 Ab, 4th Generation NON-REACTIVE NON-REACTI    Comment: HIV-1 antigen and HIV-1/HIV-2 antibodies were not detected. There is no laboratory evidence of HIV infection. Marland Kitchen PLEASE NOTE: This information has been disclosed to you from records whose confidentiality may be protected by state law.  If your state requires such protection, then the state law prohibits you from making any further disclosure of the information without the specific written consent of the person to whom it pertains, or as otherwise permitted by law. A general authorization for the release of medical or other information is NOT sufficient for this purpose. . For additional information please refer to http://education.[facility].com/faq/FAQ106 (This link is being provided for informational/ educational purposes only.) . Marland Kitchen The performance of this assay has not been clinically validated in patients less than 28 years old. .   Basic metabolic panel     Status: None   Collection Time: 10/19/18 12:00 AM  Result Value Ref Range   Glucose 113   Hemoglobin A1c     Status: None   Collection Time: 10/19/18 12:00 AM  Result Value Ref Range   Hemoglobin A1C 4.9   BASIC METABOLIC PANEL WITH GFR     Status: Abnormal   Collection Time: 11/20/18 12:00 AM  Result Value Ref Range   Glucose, Bld 94 65 - 99 mg/dL    Comment: .            Fasting reference interval .    BUN 26 (H) 7 - 25 mg/dL   Creat 1.05 0.50 - 1.05 mg/dL    Comment: For patients >39 years of age, the reference limit for Creatinine is approximately 13% higher for people identified as African-American. .    GFR, Est Non African American 61 > OR = 60 mL/min/1.28m   GFR, Est African American 70 > OR =  60 mL/min/1.41m   BUN/Creatinine Ratio 25 (H) 6 - 22 (calc)   Sodium 141 135 - 146 mmol/L   Potassium 3.8 3.5 - 5.3 mmol/L   Chloride 103 98 - 110 mmol/L   CO2 28 20 - 32 mmol/L   Calcium 9.8  8.6 - 10.4 mg/dL     Assessment & Plan  1. Abscess  - doxycycline (VIBRA-TABS) 100 MG tablet; Take 1 tablet (100 mg total) by mouth 2 (two) times daily.  Dispense: 20 tablet; Refill: 0 - mupirocin ointment (BACTROBAN) 2 %; Place 1 application into the nose 2 (two) times daily.  Dispense: 22 g; Refill: 0 Discussed bathing once a week with hot tub filled with half cup clorox bleach and to return for follow up if needed.   2. Esophageal dysphagia  Discussed PPI, also discussed at this time gastroenterologist not doing any elective procedures. She will wait until COVID-19 is gone and she follows up with ERaelyn Ensign She is following a GERD diet

## 2019-01-01 NOTE — Patient Instructions (Signed)
Take a bath once a week, bathtub filled with water and half cup of clorox bleach

## 2019-01-11 ENCOUNTER — Encounter: Payer: Self-pay | Admitting: Family Medicine

## 2019-01-20 ENCOUNTER — Telehealth: Payer: Self-pay | Admitting: Family Medicine

## 2019-01-20 DIAGNOSIS — F4310 Post-traumatic stress disorder, unspecified: Secondary | ICD-10-CM

## 2019-01-21 NOTE — Telephone Encounter (Signed)
Patient needs follow up - virtual to check in on how Lexapro is working.

## 2019-01-22 NOTE — Telephone Encounter (Signed)
LVM for pt to call the office and schedule an appt for med refill

## 2019-01-24 ENCOUNTER — Ambulatory Visit
Admission: RE | Admit: 2019-01-24 | Discharge: 2019-01-24 | Disposition: A | Discharge status: Home / Self Care | Payer: PRIVATE HEALTH INSURANCE | Source: Ambulatory Visit | Attending: Family Medicine | Admitting: Family Medicine

## 2019-01-24 ENCOUNTER — Other Ambulatory Visit: Payer: Self-pay

## 2019-01-24 ENCOUNTER — Other Ambulatory Visit
Admission: RE | Admit: 2019-01-24 | Discharge: 2019-01-24 | Disposition: A | Discharge status: Home / Self Care | Payer: PRIVATE HEALTH INSURANCE | Source: Ambulatory Visit | Attending: Family Medicine | Admitting: Family Medicine

## 2019-01-24 ENCOUNTER — Ambulatory Visit (INDEPENDENT_AMBULATORY_CARE_PROVIDER_SITE_OTHER): Payer: PRIVATE HEALTH INSURANCE | Admitting: Family Medicine

## 2019-01-24 ENCOUNTER — Encounter: Payer: Self-pay | Admitting: Family Medicine

## 2019-01-24 ENCOUNTER — Other Ambulatory Visit: Payer: Self-pay | Admitting: Family Medicine

## 2019-01-24 DIAGNOSIS — R112 Nausea with vomiting, unspecified: Secondary | ICD-10-CM

## 2019-01-24 DIAGNOSIS — R197 Diarrhea, unspecified: Secondary | ICD-10-CM

## 2019-01-24 DIAGNOSIS — R1011 Right upper quadrant pain: Secondary | ICD-10-CM | POA: Insufficient documentation

## 2019-01-24 LAB — COMPREHENSIVE METABOLIC PANEL
ALT: 30 U/L (ref 0–44)
AST: 34 U/L (ref 15–41)
Albumin: 4.3 g/dL (ref 3.5–5.0)
Alkaline Phosphatase: 68 U/L (ref 38–126)
Anion gap: 11 (ref 5–15)
BUN: 29 mg/dL — ABNORMAL HIGH (ref 6–20)
CO2: 21 mmol/L — ABNORMAL LOW (ref 22–32)
Calcium: 9.2 mg/dL (ref 8.9–10.3)
Chloride: 106 mmol/L (ref 98–111)
Creatinine, Ser: 1.08 mg/dL — ABNORMAL HIGH (ref 0.44–1.00)
GFR calc Af Amer: >60 mL/min (ref >60)
GFR calc non Af Amer: 58 mL/min — ABNORMAL LOW (ref >60)
Glucose, Bld: 121 mg/dL — ABNORMAL HIGH (ref 70–99)
Potassium: 4.2 mmol/L (ref 3.5–5.1)
Sodium: 138 mmol/L (ref 135–145)
Total Bilirubin: 1.3 mg/dL — ABNORMAL HIGH (ref 0.3–1.2)
Total Protein: 7.6 g/dL (ref 6.5–8.1)

## 2019-01-24 LAB — CBC WITH DIFFERENTIAL/PLATELET
Abs Immature Granulocytes: 0.03 10*3/uL (ref 0.00–0.07)
Basophils Absolute: 0 10*3/uL (ref 0.0–0.1)
Basophils Relative: 0 %
Eosinophils Absolute: 0 10*3/uL (ref 0.0–0.5)
Eosinophils Relative: 0 %
HCT: 41.2 % (ref 36.0–46.0)
Hemoglobin: 14.4 g/dL (ref 12.0–15.0)
Immature Granulocytes: 0 %
Lymphocytes Relative: 24 %
Lymphs Abs: 2.2 10*3/uL (ref 0.7–4.0)
MCH: 31.9 pg (ref 26.0–34.0)
MCHC: 35 g/dL (ref 30.0–36.0)
MCV: 91.4 fL (ref 80.0–100.0)
Monocytes Absolute: 0.6 10*3/uL (ref 0.1–1.0)
Monocytes Relative: 6 %
Neutro Abs: 6.5 10*3/uL (ref 1.7–7.7)
Neutrophils Relative %: 70 %
Platelets: 264 10*3/uL (ref 150–400)
RBC: 4.51 MIL/uL (ref 3.87–5.11)
RDW: 12.9 % (ref 11.5–15.5)
WBC: 9.3 10*3/uL (ref 4.0–10.5)
nRBC: 0 % (ref 0.0–0.2)

## 2019-01-24 LAB — LIPASE, BLOOD: Lipase: 35 U/L (ref 11–51)

## 2019-01-24 MED ORDER — DICYCLOMINE HCL 10 MG PO CAPS: 10.0000 mg | ORAL_CAPSULE | Freq: Three times a day (TID) | ORAL | 0 refills | Status: DC

## 2019-01-24 MED ORDER — ONDANSETRON HCL 4 MG PO TABS: 4.0000 mg | ORAL_TABLET | Freq: Three times a day (TID) | ORAL | 0 refills | Status: DC | PRN

## 2019-01-24 NOTE — Progress Notes (Signed)
Name: Anna Cross   MRN: [id number]    DOB: [birth year]   Date:01/24/2019       Progress Note  Subjective  Chief Complaint  Chief Complaint  Patient presents with  . Abdominal Pain    epi gastric burning after eating and cramping with severe pain in right side, nausea, bloating, diarrhea    I connected with  Freda Jackson  on 01/24/19 at 11:00 AM EDT by a video enabled telemedicine application and verified that I am speaking with the correct person using two identifiers.  I discussed the limitations of evaluation and management by telemedicine and the availability of in person appointments. The patient expressed understanding and agreed to proceed. Staff also discussed with the patient that there may be a patient responsible charge related to this service. Patient Location: Home Provider Location: Home Additional Individuals present: None  HPI  Pt presents with concern for RUQ and epigastric pain for several months.  She has been trying to ID triggers including removing coffee and unhealthy foods from her diet.  Pain had been intermittent for several months.  2 days ago, she ate dinner, had burning pain within 15 minutes of eating; then developed intense pain in the RUQ.  The pain has since remained constant, if she eats, the pain intensifies.  She has been having diarrhea, a few episodes of vomiting, feeling bloated.  The pain is starting to her mid-back.  She has been eating a pretty healthy diet - eats mostly pescetarian, minimal fried/fatty foods.  Denies fevers/chills, or urinary symptoms.   Patient Active Problem List   Diagnosis Date Noted  . Leg cramp 11/27/2018  . Polyarthralgia 11/27/2018  . Psoriasis 10/16/2018  . 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 H/O cold sores 10/16/2018  . PTSD (post-traumatic stress disorder) 10/09/2018  . Fatigue 06/07/2017  . Facial asymmetry 06/07/2017  . Other symptoms and signs involving the musculoskeletal system 05/11/2017  .  Atherosclerosis 05/03/2017  . Dyslipidemia 05/03/2017  . FH: heart disease 05/03/2017  . DM (diabetes mellitus) ([place]) 04/22/2014  . Ehlers-Danlos syndrome 04/22/2014  . Gastroparesis due to DM ([place]) 04/22/2014  . HTN (hypertension) 04/22/2014  . NASH (nonalcoholic steatohepatitis) 04/22/2014  . Rheumatoid arthritis ([place]) 04/22/2014  . Bicuspid aortic valve 12/12/2009  . Other dyspnea and respiratory abnormality 12/12/2009    Past Surgical History:  Procedure Laterality Date  . ABDOMINAL HYSTERECTOMY  1997   Partial  . BUNIONECTOMY    . PARATHYROIDECTOMY    . TONSILLECTOMY      Family History  Problem Relation Age of Onset  . Heart attack Mother   . Heart disease Father   . Diabetes Mellitus II Sister   . Liver disease Sister   . Hypertension Brother   . Ehlers-Danlos syndrome Daughter   . Ehlers-Danlos syndrome Daughter   . Ehlers-Danlos syndrome Daughter   . Breast cancer Neg Hx     Social History   Socioeconomic History  . Marital status: Divorced    Spouse name: Not on file  . Number of children: 4  . Years of education: Not on file  . Highest education level: Some college, no degree  Occupational History  . Not on file  Social Needs  . Financial resource strain: Not hard at all  . Food insecurity:    Worry: Never true    Inability: Never true  . Transportation needs:    Medical: No    Non-medical: No  Tobacco Use  . Smoking status: Never  Smoker  . Smokeless tobacco: Never Used  Substance and Sexual Activity  . Alcohol use: Yes    Alcohol/week: 2.0 standard drinks    Types: 2 Cans of beer per week  . Drug use: No  . Sexual activity: Yes    Partners: Male    Birth control/protection: Surgical  Lifestyle  . Physical activity:    Days per week: 6 days    Minutes per session: 30 min  . Stress: Very much  Relationships  . Social connections:    Talks on phone: More than three times a week    Gets together: Once a week    Attends religious  service: Never    Active member of club or organization: No    Attends meetings of clubs or organizations: Never    Relationship status: Divorced  . Intimate partner violence:    Fear of current or ex partner: No    Emotionally abused: No    Physically abused: No    Forced sexual activity: No  Other Topics Concern  . Not on file  Social History Narrative  . Not on file     Current Outpatient Medications:  .  escitalopram (LEXAPRO) 10 MG tablet, TAKE 1 TABLET BY MOUTH EVERY DAY, Disp: 30 tablet, Rfl: 0 .  hydrochlorothiazide (HYDRODIURIL) 12.5 MG tablet, Take 1 tablet (12.5 mg total) by mouth daily., Disp: 90 tablet, Rfl: 3 .  lisinopril (PRINIVIL,ZESTRIL) 40 MG tablet, TAKE 1 TABLET BY MOUTH EVERY DAY, Disp: 90 tablet, Rfl: 1 .  rosuvastatin (CRESTOR) 10 MG tablet, Take 1 tablet (10 mg total) by mouth daily., Disp: 90 tablet, Rfl: 1 .  diphenhydrAMINE (BENADRYL) 50 MG capsule, Take 50 mg by mouth at bedtime., Disp: , Rfl:  .  doxycycline (VIBRA-TABS) 100 MG tablet, Take 1 tablet (100 mg total) by mouth 2 (two) times daily. (Patient not taking: Reported on 01/24/2019), Disp: 20 tablet, Rfl: 0 .  Melatonin 10 MG TABS, Take 10 mg by mouth at bedtime., Disp: , Rfl:  .  mupirocin ointment (BACTROBAN) 2 %, Place 1 application into the nose 2 (two) times daily. (Patient not taking: Reported on 01/24/2019), Disp: 22 g, Rfl: 0  Allergies  Allergen Reactions  . Penicillins Hives    I personally reviewed active problem list, medication list, allergies, health maintenance, notes from last encounter, lab results with the patient/caregiver today.   ROS Constitutional: Negative for fever or weight change.  Respiratory: Negative for cough and shortness of breath.   Cardiovascular: Negative for chest pain or palpitations.  Gastrointestinal: See HPI Musculoskeletal: Negative for gait problem or joint swelling.  Skin: Negative for rash.  Neurological: Negative for dizziness or headache.  No other  specific complaints in a complete review of systems (except as listed in HPI above).  Objective  Virtual encounter, vitals not obtained.  There is no height or weight on file to calculate BMI.  Physical Exam Constitutional: Patient appears well-developed and well-nourished. No distress.  HENT: Head: Normocephalic and atraumatic.  Neck: Normal range of motion. Pulmonary/Chest: Effort normal. No respiratory distress. Speaking in complete sentences Neurological: Pt is alert and oriented to person, place, and time. Coordination, speech and gait are normal.  Psychiatric: Patient has a normal mood and affect. behavior is normal. Judgment and thought content normal. Abdominal: Patient does palpate abdomen and endorses tenderness over the RUQ, however I am unable to evaluate for murphy's sign.  No results found for this or any previous visit (from the past 72 hour(s)).  PHQ2/9: Depression screen [facility] 2/9 01/24/2019 11/06/2018 10/09/2018  Decreased Interest 0 0 0  Down, Depressed, Hopeless 0 0 0  PHQ - 2 Score 0 0 0  Altered sleeping 0 2 2  Tired, decreased energy 0 0 1  Change in appetite 0 0 0  Feeling bad or failure about yourself  0 0 0  Trouble concentrating 0 0 0  Moving slowly or fidgety/restless 0 0 0  Suicidal thoughts 0 0 0  PHQ-9 Score 0 2 3  Difficult doing work/chores Not difficult at all Not difficult at all Not difficult at all   PHQ-2/9 Result is negative.    Fall Risk: Fall Risk  01/24/2019 01/01/2019 11/06/2018 10/09/2018  Falls in the past year? 0 0 0 0  Number falls in past yr: 0 0 0 -  Injury with Fall? 0 0 0 -  Follow up - - Falls evaluation completed -    Assessment & Plan  1. RUQ pain - Comprehensive metabolic panel; Future - CBC with Differential/Platelet; Future - US Abdomen Limited RUQ; Future - Lipase, blood  2. Nausea vomiting and diarrhea - Comprehensive metabolic panel; Future - CBC with Differential/Platelet; Future - US Abdomen Limited RUQ; Future  - Lipase, blood  Will evaluate for acute cholecystitis.  Depending on severity disposition may be OP visit with general surgeon vs ER presentation.  She will present to medical mall for stat labs and imaging once imaging has been scheduled through our referrals coordinator.  I discussed the assessment and treatment plan with the patient. The patient was provided an opportunity to ask questions and all were answered. The patient agreed with the plan and demonstrated an understanding of the instructions.  The patient was advised to call back or seek an in-person evaluation if the symptoms worsen or if the condition fails to improve as anticipated.  I provided 17 minutes of non-face-to-face time during this encounter.

## 2019-01-29 ENCOUNTER — Ambulatory Visit: Payer: PRIVATE HEALTH INSURANCE | Admitting: Gastroenterology

## 2019-01-30 ENCOUNTER — Encounter: Payer: Self-pay | Admitting: Gastroenterology

## 2019-01-30 ENCOUNTER — Telehealth: Payer: Self-pay

## 2019-01-30 ENCOUNTER — Ambulatory Visit (INDEPENDENT_AMBULATORY_CARE_PROVIDER_SITE_OTHER): Payer: PRIVATE HEALTH INSURANCE | Admitting: Gastroenterology

## 2019-01-30 DIAGNOSIS — R1011 Right upper quadrant pain: Secondary | ICD-10-CM

## 2019-01-30 MED ORDER — PANTOPRAZOLE SODIUM 40 MG PO TBEC: 40.0000 mg | DELAYED_RELEASE_TABLET | Freq: Every day | ORAL | 3 refills | Status: DC

## 2019-01-30 NOTE — Telephone Encounter (Signed)
Pt has been scheduled for a HIDA scan at [facility] on Tuesday, May 19th at 9:00am. Left vm informing pt of this information and instructions prior to scan.

## 2019-01-30 NOTE — Progress Notes (Signed)
Anna Lame, MD [address]  [place]  [place], [place] [address]  Main: [phone]  Fax: [phone]    Gastroenterology Virtual/Video Visit  Referring Provider:     Hubbard Hartshorn, FNP Primary Care Physician:  Hubbard Hartshorn, FNP Primary Gastroenterologist:  Dr.Anaija Wissink Allen Norris Reason for Consultation:     Epigastric pain and right upper quadrant pain        HPI:    Virtual Visit via Video Note Location of the patient: Work Location of provider: Office  Participating persons: The patient myself and Ginger Feldpausch.  I connected with Anna Cross on 01/30/19 at 11:45 AM EDT by a video enabled telemedicine application and verified that I am speaking with the correct person using two identifiers.   I discussed the limitations of evaluation and management by telemedicine and the availability of in person appointments. The patient expressed understanding and agreed to proceed.  Verbal consent to proceed obtained.  History of Present Illness: Anna Cross is a 53 y.o. female referred by Dr. Hubbard Hartshorn, FNP  for consultation & management of epigastric pain and right upper quadrant pain.  This patient was seen by her primary care provider on the sixth of this month for right upper quadrant pain and epigastric pain for last few months.  The patient had been trying to avoid possible triggers by removing coffee and other unhealthy foods from her diet.  The patient symptoms include burning after eating that usually last for approximately 15 minutes and she has developed right upper quadrant pain that she reports to be intense.  The pain does radiate to her mid back.  The patient has also reported diarrhea with vomiting and bloating.  The patient had an ultrasound that showed no evidence of cholelithiasis or cholecystitis but there was a simple hepatic cyst found as an incidental finding. The patient had lab work done that showed a elevated bilirubin of 1.3 but normal AST and ALT and  alkaline phosphatase.  The patient's white cell count and hemoglobin/hematocrit were also found to be normal. The patient states that she has gained weight recently despite not eating very much.  She also reports that her abdominal pain in the right side is a burning pain that happened shortly after she eats.  She states that the burning pain will also keep her up at night when she is laying on that side.  There is no change in bowel habits.  The patient has not tried any PPI for this pain.  Past Medical History:  Diagnosis Date  . Arthritis   . Ehlers-Danlos disease   . Hypertension   . Parathyroid tumor   . Pre-diabetes     Past Surgical History:  Procedure Laterality Date  . ABDOMINAL HYSTERECTOMY  1997   Partial  . BUNIONECTOMY    . PARATHYROIDECTOMY    . TONSILLECTOMY      Prior to Admission medications   Medication Sig Start Date End Date Taking? Authorizing Provider  dicyclomine (BENTYL) 10 MG capsule Take 1 capsule (10 mg total) by mouth 4 (four) times daily -  before meals and at bedtime. 01/24/19   Hubbard Hartshorn, FNP  diphenhydrAMINE (BENADRYL) 50 MG capsule Take 50 mg by mouth at bedtime.    [provider]  doxycycline (VIBRA-TABS) 100 MG tablet Take 1 tablet (100 mg total) by mouth 2 (two) times daily. Patient not taking: Reported on 01/24/2019 01/01/19   Steele Sizer, MD  escitalopram (LEXAPRO) 10 MG tablet TAKE  1 TABLET BY MOUTH EVERY DAY 01/21/19   Hubbard Hartshorn, FNP  hydrochlorothiazide (HYDRODIURIL) 12.5 MG tablet Take 1 tablet (12.5 mg total) by mouth daily. 11/06/18   Hubbard Hartshorn, FNP  lisinopril (PRINIVIL,ZESTRIL) 40 MG tablet TAKE 1 TABLET BY MOUTH EVERY DAY 12/10/18   Hubbard Hartshorn, FNP  Melatonin 10 MG TABS Take 10 mg by mouth at bedtime.    [provider]  mupirocin ointment (BACTROBAN) 2 % Place 1 application into the nose 2 (two) times daily. Patient not taking: Reported on 01/24/2019 01/01/19   Steele Sizer, MD  ondansetron (ZOFRAN) 4 MG  tablet Take 1 tablet (4 mg total) by mouth every 8 (eight) hours as needed for nausea or vomiting. 01/24/19   Hubbard Hartshorn, FNP  rosuvastatin (CRESTOR) 10 MG tablet Take 1 tablet (10 mg total) by mouth daily. 10/11/18   Hubbard Hartshorn, FNP    Family History  Problem Relation Age of Onset  . Heart attack Mother   . Heart disease Father   . Diabetes Mellitus II Sister   . Liver disease Sister   . Hypertension Brother   . Ehlers-Danlos syndrome Daughter   . Ehlers-Danlos syndrome Daughter   . Ehlers-Danlos syndrome Daughter   . Breast cancer Neg Hx      Social History   Tobacco Use  . Smoking status: Never Smoker  . Smokeless tobacco: Never Used  Substance Use Topics  . Alcohol use: Yes    Alcohol/week: 2.0 standard drinks    Types: 2 Cans of beer per week  . Drug use: No    Allergies as of 01/30/2019 - Review Complete 01/24/2019  Allergen Reaction Noted  . Penicillins Hives 08/21/2009    Review of Systems:    All systems reviewed and negative except where noted in HPI.   Observations/Objective:  Labs: CBC    Component Value Date/Time   WBC 9.3 01/24/2019 1209   RBC 4.51 01/24/2019 1209   HGB 14.4 01/24/2019 1209   HCT 41.2 01/24/2019 1209   PLT 264 01/24/2019 1209   MCV 91.4 01/24/2019 1209   MCH 31.9 01/24/2019 1209   MCHC 35.0 01/24/2019 1209   RDW 12.9 01/24/2019 1209   LYMPHSABS 2.2 01/24/2019 1209   MONOABS 0.6 01/24/2019 1209   EOSABS 0.0 01/24/2019 1209   BASOSABS 0.0 01/24/2019 1209   CMP     Component Value Date/Time   NA 138 01/24/2019 1209   K 4.2 01/24/2019 1209   CL 106 01/24/2019 1209   CO2 21 (L) 01/24/2019 1209   GLUCOSE 121 (H) 01/24/2019 1209   BUN 29 (H) 01/24/2019 1209   CREATININE 1.08 (H) 01/24/2019 1209   CREATININE 1.05 11/20/2018 0000   CALCIUM 9.2 01/24/2019 1209   PROT 7.6 01/24/2019 1209   ALBUMIN 4.3 01/24/2019 1209   ALBUMIN 4.6 05/20/2017 1214   AST 34 01/24/2019 1209   ALT 30 01/24/2019 1209   ALKPHOS 68  01/24/2019 1209   BILITOT 1.3 (H) 01/24/2019 1209   GFRNONAA 58 (L) 01/24/2019 1209   GFRNONAA 61 11/20/2018 0000   GFRAA >60 01/24/2019 1209   GFRAA 70 11/20/2018 0000    Imaging Studies: US Abdomen Limited Ruq  Result Date: 01/24/2019 CLINICAL DATA:  53 year old female with nausea, vomiting and diarrhea for several months EXAM: ULTRASOUND ABDOMEN LIMITED RIGHT UPPER QUADRANT COMPARISON:  None. FINDINGS: Gallbladder: No gallstones or wall thickening visualized. No sonographic Murphy sign noted by sonographer. Common bile duct: Diameter: Normal at 3 mm Liver:  No focal solid lesion identified. The parenchyma is normal in echogenicity and echotexture. A circumscribed anechoic cyst with out evidence of internal vascularity or solid component in the right liver measures 3.1 x 2.8 x 2.1 cm. The imaging characteristics are consistent with a simple cyst. Portal vein is patent on color Doppler imaging with normal direction of blood flow towards the liver. IMPRESSION: 1. No evidence of cholelithiasis or cholecystitis. 2. Incidental note is made of a simple hepatic cyst. Electronically Signed   By: Jacqulynn Cadet M.D.   On: 01/24/2019 13:11    Assessment and Plan:   Jaia Alonge is a 53 y.o. y/o female has been referred for epigastric and right-sided abdominal pain.  The patient's right side abdominal pain appears to be a burning sensation from her description and may be musculoskeletal.  She states that she had a debilitating pain about a week ago which may have been a muscle spasm with her reporting residual pain over the last week that is slowly getting better.  This may be from muscle strain.  I have asked her to go home and flex the abdominal wall muscles while laying on her back and palpating the abdominal wall with one finger to see if that exacerbates the pain.  That would be consistent with musculoskeletal pain.  Despite that the patient has had right upper quadrant discomfort in the past and  epigastric pain and she will be set up for a gallbladder emptying study.  The patient will also be put on Protonix 40 mg a day for the next few weeks to see if that helps with her burning.  If the patient does not have any relief from the Protonix she has been told that she will be set up for a upper endoscopy.  The patient has been explained the plan and agrees with it  Follow Up Instructions:  I discussed the assessment and treatment plan with the patient. The patient was provided an opportunity to ask questions and all were answered. The patient agreed with the plan and demonstrated an understanding of the instructions.   The patient was advised to call back or seek an in-person evaluation if the symptoms worsen or if the condition fails to improve as anticipated.  I provided 20 minutes of non-face-to-face time during this encounter.   Anna Lame, MD  Speech recognition software was used to dictate the above note.

## 2019-02-06 ENCOUNTER — Ambulatory Visit: Payer: PRIVATE HEALTH INSURANCE

## 2019-02-08 ENCOUNTER — Ambulatory Visit: Payer: PRIVATE HEALTH INSURANCE

## 2019-02-14 ENCOUNTER — Telehealth: Payer: Self-pay | Admitting: Family Medicine

## 2019-02-14 DIAGNOSIS — F4310 Post-traumatic stress disorder, unspecified: Secondary | ICD-10-CM

## 2019-02-14 NOTE — Telephone Encounter (Signed)
Please call patient to schedule a routine follow up in the next month.

## 2019-02-14 NOTE — Telephone Encounter (Signed)
Appt scheduled

## 2019-03-19 ENCOUNTER — Ambulatory Visit (INDEPENDENT_AMBULATORY_CARE_PROVIDER_SITE_OTHER): Payer: PRIVATE HEALTH INSURANCE | Admitting: Family Medicine

## 2019-03-19 ENCOUNTER — Other Ambulatory Visit: Payer: Self-pay

## 2019-03-19 ENCOUNTER — Encounter: Payer: Self-pay | Admitting: Family Medicine

## 2019-03-19 DIAGNOSIS — Q796 Ehlers-Danlos syndrome, unspecified: Secondary | ICD-10-CM

## 2019-03-19 DIAGNOSIS — E785 Hyperlipidemia, unspecified: Secondary | ICD-10-CM

## 2019-03-19 DIAGNOSIS — K3184 Gastroparesis: Secondary | ICD-10-CM

## 2019-03-19 DIAGNOSIS — R140 Abdominal distension (gaseous): Secondary | ICD-10-CM

## 2019-03-19 DIAGNOSIS — E1169 Type 2 diabetes mellitus with other specified complication: Secondary | ICD-10-CM

## 2019-03-19 DIAGNOSIS — M069 Rheumatoid arthritis, unspecified: Secondary | ICD-10-CM

## 2019-03-19 DIAGNOSIS — I10 Essential (primary) hypertension: Secondary | ICD-10-CM | POA: Diagnosis not present

## 2019-03-19 DIAGNOSIS — R197 Diarrhea, unspecified: Secondary | ICD-10-CM

## 2019-03-19 DIAGNOSIS — F4310 Post-traumatic stress disorder, unspecified: Secondary | ICD-10-CM | POA: Diagnosis not present

## 2019-03-19 DIAGNOSIS — E1143 Type 2 diabetes mellitus with diabetic autonomic (poly)neuropathy: Secondary | ICD-10-CM

## 2019-03-19 DIAGNOSIS — I7090 Unspecified atherosclerosis: Secondary | ICD-10-CM

## 2019-03-19 DIAGNOSIS — R112 Nausea with vomiting, unspecified: Secondary | ICD-10-CM

## 2019-03-19 DIAGNOSIS — R109 Unspecified abdominal pain: Secondary | ICD-10-CM

## 2019-03-19 NOTE — Progress Notes (Signed)
Name: Anna Cross   MRN: [id number]    DOB: Nov 15, 1965   Date:03/19/2019       Progress Note  Subjective  Chief Complaint  Chief Complaint  Patient presents with   Follow-up   Abdominal Pain    had [place] and GI no positive results, still having issues   Foot Injury    left heel pain    I connected with  Freda Jackson  on 03/19/19 at  7:20 AM EDT by a video enabled telemedicine application and verified that I am speaking with the correct person using two identifiers.  I discussed the limitations of evaluation and management by telemedicine and the availability of in person appointments. The patient expressed understanding and agreed to proceed. Staff also discussed with the patient that there may be a patient responsible charge related to this service. Patient Location: Home Provider Location: Home Additional Individuals present: None  HPI  Pt presents for follow up:  Abdominal Pain: Pt saw Dr. Allen Norris, but would like to follow up with a female provider.  She was prescribed protonix which did not help her.  RUQ pain has somewhat subsided, but her generalized abdomen is painful, "gassy and crampy".  She has been trying to ID triggers including removing coffee and unhealthy foods from her diet.  Pain had been intermittent for several months.  She has been having loose stools (no longer diarrhea), a few episodes of vomiting, feeling bloated.  She has been eating a pretty healthy diet - eats mostly pescetarian, minimal fried/fatty foods.  Denies fevers/chills, or urinary symptoms.   Ehlers-Danlos vs RA: She was diagnosed along with her 3 children after her daugher was having repetitive dislocations. Daughter was diagnosed at [facility]. 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 She would like to be connected with a specialist - has seen neurologist with [facility] for nerve conduction studies in the past, but this was not sub-specialist. 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 Does endorse ongoing physical pain, has hx diagnosis of arthritis - thinks it was RA, but wonders if also related to Ehlers-Danlos. She did do a lot of RA treatment for several years and none of it helped. Records review shows that she was seeing Dr. Melrose Nakayama in the past.  We did refer to ED clinic with [facility], but they are booked out over a year.  No hx vascular ED, just hypermobility. Referred to rheumatology at last visit.  Unchanged since last visit.  HTN: Taking 71m Lisinopril and 12.5 mg HCTZ without issue.  Has been doing well on this regimen but has not checked BP lately. Does follow low sodium diet. She denies abnormal headaches, vision changes, confusion, slurred speech, extremity weakness, chest pain, or shortness of breath, or BLE edema.  Plantar Fasciitis: She has been struggling for 3-4 weeks with a flare of LEFT sided plantar fasciitis pain in the heel that is worse after long period of rest and then applying pressure to the area.  She has not done anything for the pain yet.    PTSD/Depression: Daughter died [place] 2017 She has tried counseling in the past and did not work well for her. Has trouble sleeping - trazodone made her too groggy.  She notes some lack of motivation and feeling down which has improved with Lexapro. Denies SI/HI. She is doing well on 164mLexapro.  Not needing melatonin that often right now.  History of DM: She had A1C performed in January and it was 4.9. Denies polyuria, polydipsia, and polyphagia. She is doing well without medications.  Urine Micro is UTD.  On ACE-I and Statin.  Due for foot exam.   HLD: Taking statin, no chest pain, shortness of breath, or myalgias.  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   H/O cold sores 10/16/2018   PTSD  (post-traumatic stress disorder) 10/09/2018   Fatigue 06/07/2017   Facial asymmetry 06/07/2017   Other symptoms and signs involving the musculoskeletal system 05/11/2017   Atherosclerosis 05/03/2017   Dyslipidemia 05/03/2017   FH: heart disease 05/03/2017   DM (diabetes mellitus) ([place]) 04/22/2014   Ehlers-Danlos syndrome 04/22/2014   Gastroparesis due to DM ([place]) 04/22/2014   HTN (hypertension) 04/22/2014   NASH (nonalcoholic steatohepatitis) 04/22/2014   Rheumatoid arthritis ([place]) 04/22/2014   Bicuspid aortic valve 12/12/2009   Other dyspnea and respiratory abnormality 12/12/2009    Past Surgical History:  Procedure Laterality Date   ABDOMINAL HYSTERECTOMY  1997   Partial   BUNIONECTOMY     PARATHYROIDECTOMY     TONSILLECTOMY      Family History  Problem Relation Age of Onset   Heart attack Mother    Heart disease Father    Diabetes Mellitus II Sister    Liver disease Sister    Hypertension Brother    Ehlers-Danlos syndrome Daughter    Ehlers-Danlos syndrome Daughter    Ehlers-Danlos syndrome Daughter    Breast cancer Neg Hx     Social History   Socioeconomic History   Marital status: Divorced    Spouse name: Not on file   Number of children: 4   Years of education: Not on file   Highest education level: Some college, no degree  Occupational History   Not on file  Social Needs   Financial resource strain: Not hard at all   Food insecurity    Worry: Never true    Inability: Never true   Transportation needs    Medical: No    Non-medical: No  Tobacco Use   Smoking status: Never Smoker   Smokeless tobacco: Never Used  Substance and Sexual Activity   Alcohol use: Yes    Alcohol/week: 2.0 standard drinks    Types: 2 Cans of beer per week   Drug use: No   Sexual activity: Yes    Partners: Male    Birth control/protection: Surgical  Lifestyle   Physical activity    Days per week: 6 days    Minutes per session:  30 min   Stress: Very much  Relationships   Social connections    Talks on phone: More than three times a week    Gets together: Once a week    Attends religious service: Never    Active member of club or organization: No    Attends meetings of clubs or organizations: Never    Relationship status: Divorced   Intimate partner violence    Fear of current or ex partner: No    Emotionally abused: No    Physically abused: No    Forced sexual activity: No  Other Topics Concern   Not on file  Social History Narrative   Not on file     Current Outpatient Medications:    escitalopram (LEXAPRO) 10 MG tablet, TAKE 1 TABLET BY MOUTH EVERY  DAY, Disp: 90 tablet, Rfl: 0   hydrochlorothiazide (HYDRODIURIL) 12.5 MG tablet, Take 1 tablet (12.5 mg total) by mouth daily., Disp: 90 tablet, Rfl: 3   lisinopril (PRINIVIL,ZESTRIL) 40 MG tablet, TAKE 1 TABLET BY MOUTH EVERY DAY, Disp: 90 tablet, Rfl: 1   pantoprazole (PROTONIX) 40 MG tablet, Take 1 tablet (40 mg total) by mouth daily., Disp: 30 tablet, Rfl: 3   rosuvastatin (CRESTOR) 10 MG tablet, Take 1 tablet (10 mg total) by mouth daily., Disp: 90 tablet, Rfl: 1   dicyclomine (BENTYL) 10 MG capsule, Take 1 capsule (10 mg total) by mouth 4 (four) times daily -  before meals and at bedtime. (Patient not taking: Reported on 03/19/2019), Disp: 20 capsule, Rfl: 0   diphenhydrAMINE (BENADRYL) 50 MG capsule, Take 50 mg by mouth at bedtime., Disp: , Rfl:    Melatonin 10 MG TABS, Take 10 mg by mouth at bedtime., Disp: , Rfl:    mupirocin ointment (BACTROBAN) 2 %, Place 1 application into the nose 2 (two) times daily. (Patient not taking: Reported on 01/24/2019), Disp: 22 g, Rfl: 0   ondansetron (ZOFRAN) 4 MG tablet, Take 1 tablet (4 mg total) by mouth every 8 (eight) hours as needed for nausea or vomiting. (Patient not taking: Reported on 03/19/2019), Disp: 20 tablet, Rfl: 0  Allergies  Allergen Reactions   Penicillins Hives    I personally  reviewed active problem list, medication list, allergies, notes from last encounter, lab results with the patient/caregiver today.   ROS Constitutional: Negative for fever or weight change.  Respiratory: Negative for cough and shortness of breath.   Cardiovascular: Negative for chest pain or palpitations.  Gastrointestinal: See HPI  Musculoskeletal: Negative for gait problem or joint swelling.  Skin: Negative for rash.  Neurological: Negative for dizziness or headache.  No other specific complaints in a complete review of systems (except as listed in HPI above).  Objective  Virtual encounter, vitals not obtained.  There is no height or weight on file to calculate BMI.  Physical Exam Constitutional: Patient appears well-developed and well-nourished. No distress.  HENT: Head: Normocephalic and atraumatic.  Neck: Normal range of motion. Pulmonary/Chest: Effort normal. No respiratory distress. Speaking in complete sentences Neurological: Pt is alert and oriented to person, place, and time. Coordination, speech are normal.  Psychiatric: Patient has a normal mood and affect. behavior is normal. Judgment and thought content normal.  No results found for this or any previous visit (from the past 72 hour(s)).  PHQ2/9: Depression screen [facility] 2/9 03/19/2019 01/24/2019 11/06/2018 10/09/2018  Decreased Interest 0 0 0 0  Down, Depressed, Hopeless 0 0 0 0  PHQ - 2 Score 0 0 0 0  Altered sleeping 0 0 2 2  Tired, decreased energy 0 0 0 1  Change in appetite 0 0 0 0  Feeling bad or failure about yourself  0 0 0 0  Trouble concentrating 0 0 0 0  Moving slowly or fidgety/restless 0 0 0 0  Suicidal thoughts 0 0 0 0  PHQ-9 Score 0 0 2 3  Difficult doing work/chores Not difficult at all Not difficult at all Not difficult at all Not difficult at all   PHQ-2/9 Result is negative.    Fall Risk: Fall Risk  03/19/2019 01/24/2019 01/01/2019 11/06/2018 10/09/2018  Falls in the past year? 0 0 0 0 0  Number  falls in past yr: 0 0 0 0 -  Injury with Fall? 0 0 0 0 -  Follow up Falls  evaluation completed - - Falls evaluation completed -     Assessment & Plan  1. Ehlers-Danlos syndrome - Pain has been controlled - need to check on Rheum referral.  2. Type 2 diabetes mellitus with other specified complication, without long-term current use of insulin (HCC) - No symptoms, not on meds, last A1C was 4.9%.   3. Essential hypertension - Doing well on current medications  4. PTSD (post-traumatic stress disorder) - Doing well on 31m Lexapro - more effective than 527m 5. [facility], wants referral to female provider, we will see if [facility] can switch her.  6. Rheumatoid arthritis, involving unspecified site, unspecified rheumatoid factor presence (HCC) - Pain has been controlled - need to check on Rheum referral.  7. Dyslipidemia - Taking statin without issues  8. Atherosclerosis - Taking statin without issues  9. Nausea vomiting and diarrhea - Seeing GI, wants referral to female provider, we will see if [facility] can switch her.  10. Abdominal bloating with cramps - Seeing GI, wants referral to female provider, we will see if [facility] can switch her.   I discussed the assessment and treatment plan with the patient. The patient was provided an opportunity to ask questions and all were answered. The patient agreed with the plan and demonstrated an understanding of the instructions.  The patient was advised to call back or seek an in-person evaluation if the symptoms worsen or if the condition fails to improve as anticipated.  I provided 26 minutes of non-face-to-face time during this encounter.

## 2019-04-04 ENCOUNTER — Other Ambulatory Visit: Payer: Self-pay | Admitting: Family Medicine

## 2019-04-05 ENCOUNTER — Encounter: Payer: Self-pay | Admitting: Gastroenterology

## 2019-04-05 ENCOUNTER — Encounter (INDEPENDENT_AMBULATORY_CARE_PROVIDER_SITE_OTHER): Payer: Self-pay

## 2019-04-05 ENCOUNTER — Other Ambulatory Visit: Payer: Self-pay

## 2019-04-05 ENCOUNTER — Ambulatory Visit: Payer: PRIVATE HEALTH INSURANCE | Admitting: Gastroenterology

## 2019-04-05 VITALS — BP 122/84 | HR 73 | Temp 98.1°F | Ht 64.0 in | Wt 170.4 lb

## 2019-04-05 DIAGNOSIS — R194 Change in bowel habit: Secondary | ICD-10-CM | POA: Diagnosis not present

## 2019-04-05 DIAGNOSIS — R1013 Epigastric pain: Secondary | ICD-10-CM

## 2019-04-05 DIAGNOSIS — Z1211 Encounter for screening for malignant neoplasm of colon: Secondary | ICD-10-CM

## 2019-04-05 DIAGNOSIS — R1310 Dysphagia, unspecified: Secondary | ICD-10-CM

## 2019-04-05 NOTE — Progress Notes (Signed)
Anna Cross [address]  [place], [place] [address]  Main: [phone]  Fax: [phone]   Gastroenterology Consultation  Referring Provider:     Hubbard Hartshorn, FNP Primary Care Physician:  Hubbard Hartshorn, FNP Reason for Consultation:     Abdominal pain, altered bowel habits        HPI:    Chief Complaint  Patient presents with  . New Patient (Initial Visit)    nausea, stomach pain, constipation    Anna Cross is a 53 y.o. y/o female referred for consultation & management  by Dr. Uvaldo Rising, Astrid Divine, FNP.  Patient reports since January or February 2020, therefore about 6 to 7 months, she has had loose stools multiple times a day.  She has tried diet changes, but has not identified any specific diet triggers.  No blood in stool.  Prior to that she is to have 1 formed bowel movement a day.  Also reports abdominal bloating and pain diffusely for the same time.  Cramping, dull, 5/10, nonradiating.  Not associated with emesis.  No fever or chills.  Has history of Ehler's Danlos syndrome and has researched GI related symptoms to the connective tissue disorder and feels that her symptoms are very correlated with this.  Also reports intermittent dysphagia to solid foods about 2-3 times a week.  No dysphagia to liquids.  Feels they get stuck in her throat and she swallows water and the eventually go down.  No weight loss.  No family history of colon cancer.  No prior EGD or colonoscopy.  Next   Past Medical History:  Diagnosis Date  . Arthritis   . Ehlers-Danlos disease   . Hypertension   . Parathyroid tumor   . Pre-diabetes     Past Surgical History:  Procedure Laterality Date  . ABDOMINAL HYSTERECTOMY  1997   Partial  . BUNIONECTOMY    . PARATHYROIDECTOMY    . TONSILLECTOMY      Prior to Admission medications   Medication Sig Start Date End Date Taking? Authorizing Provider  escitalopram (LEXAPRO) 10 MG tablet TAKE 1 TABLET BY MOUTH EVERY DAY  02/14/19  Yes Hubbard Hartshorn, FNP  hydrochlorothiazide (HYDRODIURIL) 12.5 MG tablet Take 1 tablet (12.5 mg total) by mouth daily. 11/06/18  Yes Hubbard Hartshorn, FNP  lisinopril (PRINIVIL,ZESTRIL) 40 MG tablet TAKE 1 TABLET BY MOUTH EVERY DAY 12/10/18  Yes Hubbard Hartshorn, FNP  rosuvastatin (CRESTOR) 10 MG tablet TAKE 1 TABLET BY MOUTH EVERY DAY 04/04/19  Yes Sowles, Drue Stager, MD  diphenhydrAMINE (BENADRYL) 50 MG capsule Take 50 mg by mouth at bedtime.    [provider]  Melatonin 10 MG TABS Take 10 mg by mouth at bedtime.    [provider]  pantoprazole (PROTONIX) 40 MG tablet Take 1 tablet (40 mg total) by mouth daily. Patient not taking: Reported on 04/05/2019 01/30/19   Lucilla Lame, MD    Family History  Problem Relation Age of Onset  . Heart attack Mother   . Heart disease Father   . Diabetes Mellitus II Sister   . Liver disease Sister   . Hypertension Brother   . Ehlers-Danlos syndrome Daughter   . Ehlers-Danlos syndrome Daughter   . Ehlers-Danlos syndrome Daughter   . Breast cancer Neg Hx      Social History   Tobacco Use  . Smoking status: Never Smoker  . Smokeless tobacco: Never Used  Substance Use Topics  . Alcohol use: Yes  Alcohol/week: 2.0 standard drinks    Types: 2 Cans of beer per week  . Drug use: No    Allergies as of 04/05/2019 - Review Complete 04/05/2019  Allergen Reaction Noted  . Penicillins Hives 08/21/2009    Review of Systems:    All systems reviewed and negative except where noted in HPI.   Physical Exam:  BP 122/84   Pulse 73   Temp 98.1 F (36.7 C) (Oral)   Ht 5' 4"  (1.626 m)   Wt 170 lb 6.4 oz (77.3 kg)   BMI 29.25 kg/m  No LMP recorded. Patient has had a hysterectomy. Psych:  Alert and cooperative. Normal mood and affect. General:   Alert,  Well-developed, well-nourished, pleasant and cooperative in NAD Head:  Normocephalic and atraumatic. Eyes:  Sclera clear, no icterus.   Conjunctiva pink. Ears:  Normal auditory  acuity. Nose:  No deformity, discharge, or lesions. Mouth:  No deformity or lesions,oropharynx pink & moist. Neck:  Supple; no masses or thyromegaly. Abdomen:  Normal bowel sounds.  No bruits.  Soft, non-tender and non-distended without masses, hepatosplenomegaly or hernias noted.  No guarding or rebound tenderness.    Msk:  Symmetrical without gross deformities. Good, equal movement & strength bilaterally. Pulses:  Normal pulses noted. Extremities:  No clubbing or edema.  No cyanosis. Neurologic:  Alert and oriented x3;  grossly normal neurologically. Skin:  Intact without significant lesions or rashes. No jaundice. Lymph Nodes:  No significant cervical adenopathy. Psych:  Alert and cooperative. Normal mood and affect.   Labs: CBC    Component Value Date/Time   WBC 9.3 01/24/2019 1209   RBC 4.51 01/24/2019 1209   HGB 14.4 01/24/2019 1209   HCT 41.2 01/24/2019 1209   PLT 264 01/24/2019 1209   MCV 91.4 01/24/2019 1209   MCH 31.9 01/24/2019 1209   MCHC 35.0 01/24/2019 1209   RDW 12.9 01/24/2019 1209   LYMPHSABS 2.2 01/24/2019 1209   MONOABS 0.6 01/24/2019 1209   EOSABS 0.0 01/24/2019 1209   BASOSABS 0.0 01/24/2019 1209   CMP     Component Value Date/Time   NA 138 01/24/2019 1209   K 4.2 01/24/2019 1209   CL 106 01/24/2019 1209   CO2 21 (L) 01/24/2019 1209   GLUCOSE 121 (H) 01/24/2019 1209   BUN 29 (H) 01/24/2019 1209   CREATININE 1.08 (H) 01/24/2019 1209   CREATININE 1.05 11/20/2018 0000   CALCIUM 9.2 01/24/2019 1209   PROT 7.6 01/24/2019 1209   ALBUMIN 4.3 01/24/2019 1209   ALBUMIN 4.6 05/20/2017 1214   AST 34 01/24/2019 1209   ALT 30 01/24/2019 1209   ALKPHOS 68 01/24/2019 1209   BILITOT 1.3 (H) 01/24/2019 1209   GFRNONAA 58 (L) 01/24/2019 1209   GFRNONAA 61 11/20/2018 0000   GFRAA >60 01/24/2019 1209   GFRAA 70 11/20/2018 0000    Imaging Studies: No results found.  Assessment and Plan:   Hanh Kertesz is a 53 y.o. y/o female has been referred for altered  bowel habits, abdominal pain  Patient attributes a lot of her symptoms to Drue Dun and its associated GI symptoms  Given that she has had altered bowel habits as of the last 6 to 7 months, further evaluation with colonoscopy and EGD is recommended  she is also due for screening colonoscopy   Colonoscopy and EGD would allow [place] to obtain biopsies for microscopic colitis, rule out IBD, obtain biopsies for celiac disease and H. Pylori  She has had previous history of surgery  and history of a connective tissue disorder and therefore SIBO is also on the differential.  If above work-up is negative, may need to consider further work-up for SIBO due to her bloating and diarrhea or esophageal motility work-up as well  In the meantime, FODMAP diet was discussed with her and handout given for the same next Use Imodium once or twice a day as needed for diarrhea  Dr Anna Cross  Speech recognition software was used to dictate the above note.

## 2019-04-16 ENCOUNTER — Telehealth: Payer: Self-pay | Admitting: Gastroenterology

## 2019-04-16 NOTE — Telephone Encounter (Signed)
Patient called & l/m stating she wanted to cx her colonoscopy scheduled for 04-26-19.

## 2019-04-16 NOTE — Telephone Encounter (Signed)
Left message that her colonoscopy canceled for 04/26/19 and for pt to please contact office to reschedule. Will also send recall letter to remind pt to reschedule procedure. Endo notified and referral updated.

## 2019-04-26 ENCOUNTER — Ambulatory Visit: Admit: 2019-04-26 | Payer: PRIVATE HEALTH INSURANCE | Admitting: Gastroenterology

## 2019-04-26 ENCOUNTER — Other Ambulatory Visit: Payer: Self-pay | Admitting: Family Medicine

## 2019-04-26 SURGERY — EGD (ESOPHAGOGASTRODUODENOSCOPY)
Anesthesia: General

## 2019-05-07 ENCOUNTER — Other Ambulatory Visit: Payer: Self-pay | Admitting: Family Medicine

## 2019-05-07 DIAGNOSIS — F4310 Post-traumatic stress disorder, unspecified: Secondary | ICD-10-CM

## 2019-05-10 ENCOUNTER — Ambulatory Visit: Payer: PRIVATE HEALTH INSURANCE | Admitting: Gastroenterology

## 2019-05-12 ENCOUNTER — Other Ambulatory Visit: Payer: Self-pay | Admitting: Family Medicine

## 2019-05-12 DIAGNOSIS — I10 Essential (primary) hypertension: Secondary | ICD-10-CM

## 2019-05-12 NOTE — Telephone Encounter (Signed)
Requested Prescriptions  Pending Prescriptions Disp Refills  . lisinopril (ZESTRIL) 40 MG tablet [Pharmacy Med Name: LISINOPRIL 40 MG TABLET] 30 tablet 5    Sig: TAKE 1 TABLET BY MOUTH EVERY DAY     Cardiovascular:  ACE Inhibitors Failed - 05/12/2019 12:54 AM      Failed - Cr in normal range and within 180 days    Creat  Date Value Ref Range Status  11/20/2018 1.05 0.50 - 1.05 mg/dL Final    Comment:    For patients >54 years of age, the reference limit for Creatinine is approximately 13% higher for people identified as African-American. .    Creatinine, Ser  Date Value Ref Range Status  01/24/2019 1.08 (H) 0.44 - 1.00 mg/dL Final         Passed - K in normal range and within 180 days    Potassium  Date Value Ref Range Status  01/24/2019 4.2 3.5 - 5.1 mmol/L Final         Passed - Patient is not pregnant      Passed - Last BP in normal range    BP Readings from Last 1 Encounters:  04/05/19 122/84         Passed - Valid encounter within last 6 months    Recent Outpatient Visits          1 month ago Ehlers-Danlos syndrome   [place], FNP   3 months ago RUQ pain   [place], FNP   4 months ago Abscess   [facility] Steele Sizer, MD   6 months ago Essential hypertension   [place], FNP   7 months ago Ehlers-Danlos syndrome   [place], [place]

## 2019-05-25 ENCOUNTER — Other Ambulatory Visit: Payer: Self-pay | Admitting: Nurse Practitioner

## 2019-05-25 NOTE — Telephone Encounter (Signed)
Please schedule routine appointment around 09/18/2019

## 2019-06-04 ENCOUNTER — Telehealth: Payer: Self-pay | Admitting: Family Medicine

## 2019-06-04 DIAGNOSIS — M79672 Pain in left foot: Secondary | ICD-10-CM

## 2019-06-04 NOTE — Telephone Encounter (Signed)
Has patient seen PCP for this complaint? Yes  *If NO, is insurance requiring patient see PCP for this issue before PCP can refer them? NO Referral for which specialty: Podiatry Preferred provider/office: Any where in [facility] Reason for referral: Heel pain

## 2019-06-04 NOTE — Telephone Encounter (Signed)
Please place order for referral if ok

## 2019-06-04 NOTE — Telephone Encounter (Signed)
Referral is placed - we talked about this at her last visit, and if not better would place referral for plantar fasciitis eval.

## 2019-06-05 NOTE — Telephone Encounter (Signed)
Please check on this.

## 2019-06-05 NOTE — Telephone Encounter (Signed)
Referral has been sent to [facility]

## 2019-06-12 ENCOUNTER — Telehealth: Payer: Self-pay

## 2019-06-12 MED ORDER — ROSUVASTATIN CALCIUM 10 MG PO TABS: 10.0000 mg | ORAL_TABLET | Freq: Every day | ORAL | 0 refills | Status: DC

## 2019-06-13 ENCOUNTER — Other Ambulatory Visit: Payer: Self-pay

## 2019-06-13 MED ORDER — ROSUVASTATIN CALCIUM 10 MG PO TABS: 10.0000 mg | ORAL_TABLET | Freq: Every day | ORAL | 0 refills | Status: DC

## 2019-06-13 NOTE — Telephone Encounter (Signed)
appt scheduled with Raquel Sarna for October

## 2019-06-13 NOTE — Telephone Encounter (Signed)
Refill request was sent to Raelyn Ensign, FNP for approval and submission.

## 2019-06-18 ENCOUNTER — Encounter: Payer: Self-pay | Admitting: Podiatry

## 2019-06-18 ENCOUNTER — Ambulatory Visit: Payer: PRIVATE HEALTH INSURANCE | Admitting: Podiatry

## 2019-06-18 ENCOUNTER — Other Ambulatory Visit: Payer: Self-pay

## 2019-06-18 ENCOUNTER — Ambulatory Visit (INDEPENDENT_AMBULATORY_CARE_PROVIDER_SITE_OTHER): Payer: PRIVATE HEALTH INSURANCE

## 2019-06-18 VITALS — BP 164/106 | HR 65 | Resp 16

## 2019-06-18 DIAGNOSIS — M722 Plantar fascial fibromatosis: Secondary | ICD-10-CM

## 2019-06-18 MED ORDER — MELOXICAM 15 MG PO TABS: 15.0000 mg | ORAL_TABLET | Freq: Every day | ORAL | 3 refills | Status: AC

## 2019-06-18 MED ORDER — METHYLPREDNISOLONE 4 MG PO TBPK: ORAL_TABLET | ORAL | 0 refills | Status: AC

## 2019-06-18 NOTE — Patient Instructions (Signed)

## 2019-06-18 NOTE — Progress Notes (Signed)
Subjective:  Patient ID: Anna Cross, female    DOB: 04-19-1966,  MRN: [id number] HPI Chief Complaint  Patient presents with  . Foot Pain    Plantar and posterior heel left - aching x 6 months, AM pain, starting to radiate into calf and lateral side of foot (history of injury years ago), tried aleve, ice, icy hot, gel heel pads-no help  . New Patient (Initial Visit)    53 y.o. female presents with the above complaint.   ROS: She denies fever chills nausea vomiting muscle aches pains calf pain back pain chest pain shortness of breath.  Past Medical History:  Diagnosis Date  . Arthritis   . Ehlers-Danlos disease   . Hypertension   . Parathyroid tumor   . Pre-diabetes    Past Surgical History:  Procedure Laterality Date  . ABDOMINAL HYSTERECTOMY  1997   Partial  . BUNIONECTOMY    . PARATHYROIDECTOMY    . TONSILLECTOMY      Current Outpatient Medications:  .  escitalopram (LEXAPRO) 10 MG tablet, TAKE 1 TABLET BY MOUTH EVERY DAY, Disp: 30 tablet, Rfl: 5 .  hydrochlorothiazide (HYDRODIURIL) 12.5 MG tablet, Take 1 tablet (12.5 mg total) by mouth daily., Disp: 90 tablet, Rfl: 3 .  lisinopril (ZESTRIL) 40 MG tablet, TAKE 1 TABLET BY MOUTH EVERY DAY, Disp: 30 tablet, Rfl: 5 .  meloxicam (MOBIC) 15 MG tablet, Take 1 tablet (15 mg total) by mouth daily., Disp: 30 tablet, Rfl: 3 .  methylPREDNISolone (MEDROL DOSEPAK) 4 MG TBPK tablet, 6 day dose pack - take as directed, Disp: 21 tablet, Rfl: 0  Allergies  Allergen Reactions  . Penicillins Hives   Review of Systems Objective:   Vitals:   06/18/19 1052  BP: (!) 164/106  Pulse: 65  Resp: 16    General: Well developed, nourished, in no acute distress, alert and oriented x3 blood pressure is elevated 164/106 she states that she has not been taking her blood pressure medicine regularly.  Dermatological: Skin is warm, dry and supple bilateral. Nails x 10 are well maintained; remaining integument appears unremarkable at this time.  There are no open sores, no preulcerative lesions, no rash or signs of infection present.  Vascular: Dorsalis Pedis artery and Posterior Tibial artery pedal pulses are 2/4 bilateral with immedate capillary fill time. Pedal hair growth present. No varicosities and no lower extremity edema present bilateral.   Neruologic: Grossly intact via light touch bilateral. Vibratory intact via tuning fork bilateral. Protective threshold with Semmes Wienstein monofilament intact to all pedal sites bilateral. Patellar and Achilles deep tendon reflexes 2+ bilateral. No Babinski or clonus noted bilateral.   Musculoskeletal: No gross boney pedal deformities bilateral. No pain, crepitus, or limitation noted with foot and ankle range of motion bilateral. Muscular strength 5/5 in all groups tested bilateral.  Pain on palpation medial calcaneal tubercle left heel.  No pain on medial and lateral compression of the calcaneus she has no tenderness on palpation of the Achilles she does have some tenderness around the fourth and fifth tarsometatarsal joint.  Gait: Unassisted, Nonantalgic.    Radiographs:  Radiographs taken today demonstrate soft tissue increase in density at the plantar fashion calcaneal insertion site with small plantar distally oriented calcaneal heel spur.  Assessment & Plan:   Assessment: Plantar fasciitis left.  Plan: Discussed etiology pathology conservative versus surgical therapies.  At this point injected her left heel after sterile Betadine skin prep with 20 mg Kenalog 5 mg of Marcaine.  She tolerated the procedure  well.  I also started her on a Medrol Dosepak to be followed by meloxicam.  I also placed her in a plantar fascial brace to be followed by a night splint.     Jacqulin Brandenburger T. [place], [place]

## 2019-07-05 ENCOUNTER — Ambulatory Visit: Payer: PRIVATE HEALTH INSURANCE | Admitting: Family Medicine

## 2019-07-10 ENCOUNTER — Other Ambulatory Visit: Payer: Self-pay | Admitting: Family Medicine

## 2019-07-10 NOTE — Telephone Encounter (Signed)
Requested medication (s) are due for refill today: no  Requested medication (s) are on the active medication list:no  Last refill:  06/18/2019  Future visit scheduled: yes  Notes to clinic:  Medication was discontinued    Requested Prescriptions  Pending Prescriptions Disp Refills   rosuvastatin (CRESTOR) 10 MG tablet [Pharmacy Med Name: ROSUVASTATIN CALCIUM 10 MG TAB] 30 tablet 0    Sig: TAKE 1 TABLET BY MOUTH EVERY DAY     Cardiovascular:  Antilipid - Statins Failed - 07/10/2019 12:32 PM      Failed - Total Cholesterol in normal range and within 360 days    Cholesterol  Date Value Ref Range Status  10/09/2018 213 (H) <200 mg/dL Final         Failed - LDL in normal range and within 360 days    LDL Cholesterol (Calc)  Date Value Ref Range Status  10/09/2018 128 (H) mg/dL (calc) Final    Comment:    Reference range: <100 . Desirable range <100 mg/dL for primary prevention;   <70 mg/dL for patients with CHD or diabetic patients  with > or = 2 CHD risk factors. Marland Kitchen LDL-C is now calculated using the Martin-Hopkins  calculation, which is a validated novel method providing  better accuracy than the Friedewald equation in the  estimation of LDL-C.  Cresenciano Genre et al. Annamaria Helling. 6712;458(09): 2061-2068  (http://education.[employer].com/faq/FAQ164)          Passed - HDL in normal range and within 360 days    HDL  Date Value Ref Range Status  10/09/2018 59 >50 mg/dL Final         Passed - Triglycerides in normal range and within 360 days    Triglycerides  Date Value Ref Range Status  10/09/2018 148 <150 mg/dL Final         Passed - Patient is not pregnant      Passed - Valid encounter within last 12 months    Recent Outpatient Visits          3 months ago Ehlers-Danlos syndrome   [place], FNP   5 months ago RUQ pain   [place], FNP   6 months ago Abscess   [facility]  Steele Sizer, MD   8 months ago Essential hypertension   [place], FNP   9 months ago Ehlers-Danlos syndrome   [place], [place], [place]      Future Appointments            In 3 days Hubbard Hartshorn, [facility], [employer]

## 2019-07-11 ENCOUNTER — Ambulatory Visit: Payer: PRIVATE HEALTH INSURANCE | Admitting: Family Medicine

## 2019-07-11 NOTE — Telephone Encounter (Signed)
She needs an appointment for follow up.

## 2019-07-11 NOTE — Telephone Encounter (Signed)
Was this medication stopped. I do not see on chart

## 2019-07-13 ENCOUNTER — Ambulatory Visit: Payer: PRIVATE HEALTH INSURANCE | Admitting: Family Medicine

## 2019-07-16 ENCOUNTER — Ambulatory Visit: Payer: PRIVATE HEALTH INSURANCE | Admitting: Podiatry

## 2019-08-08 ENCOUNTER — Encounter: Payer: Self-pay | Admitting: Podiatry

## 2019-08-08 ENCOUNTER — Other Ambulatory Visit: Payer: Self-pay

## 2019-08-08 ENCOUNTER — Ambulatory Visit: Payer: PRIVATE HEALTH INSURANCE | Admitting: Podiatry

## 2019-08-08 DIAGNOSIS — M722 Plantar fascial fibromatosis: Secondary | ICD-10-CM | POA: Diagnosis not present

## 2019-08-08 DIAGNOSIS — S86012A Strain of left Achilles tendon, initial encounter: Secondary | ICD-10-CM

## 2019-08-08 NOTE — Progress Notes (Signed)
She presents today states that is terrible is no better my heel is killing me as she refers to the left heel.  She states that nothing seems to help.  Objective: Vital signs are stable alert and oriented x3.  Pulses are palpable.  There is no erythema edema cellulitis drainage or odor.  She has pain on palpation of the calcaneus proximally near the plantar fascial Achilles area.  Assessment: Cannot rule out a tear of the plantar fascial Achilles at this point.  Plan: The an MRI is necessary since everything we have done has failed to alleviate her symptoms.  Also placed her in a cam walker today.   Subjective:  Patient ID: Anna Cross, female    DOB: Oct 23, 1965,  MRN: [id number] HPI Chief Complaint  Patient presents with  . Plantar Fasciitis    "its terrible, no better, my heel is very painful and the Meloxicam, night splint and brace are not helping"    53 y.o. female presents with the above complaint.     Past Medical History:  Diagnosis Date  . Arthritis   . Ehlers-Danlos disease   . Hypertension   . Parathyroid tumor   . Pre-diabetes    Past Surgical History:  Procedure Laterality Date  . ABDOMINAL HYSTERECTOMY  1997   Partial  . BUNIONECTOMY    . PARATHYROIDECTOMY    . TONSILLECTOMY      Current Outpatient Medications:  .  escitalopram (LEXAPRO) 10 MG tablet, TAKE 1 TABLET BY MOUTH EVERY DAY, Disp: 30 tablet, Rfl: 5 .  hydrochlorothiazide (HYDRODIURIL) 12.5 MG tablet, Take 1 tablet (12.5 mg total) by mouth daily., Disp: 90 tablet, Rfl: 3 .  lisinopril (ZESTRIL) 40 MG tablet, TAKE 1 TABLET BY MOUTH EVERY DAY, Disp: 30 tablet, Rfl: 5 .  meloxicam (MOBIC) 15 MG tablet, Take 1 tablet (15 mg total) by mouth daily., Disp: 30 tablet, Rfl: 3 .  methylPREDNISolone (MEDROL DOSEPAK) 4 MG TBPK tablet, 6 day dose pack - take as directed, Disp: 21 tablet, Rfl: 0  Allergies  Allergen Reactions  . Penicillins Hives   Review of Systems Objective:  There were no vitals filed for  this visit.  General: Well developed, nourished, in no acute distress, alert and oriented x3   Dermatological: Skin is warm, dry and supple bilateral. Nails x 10 are well maintained; remaining integument appears unremarkable at this time. There are no open sores, no preulcerative lesions, no rash or signs of infection present.  Vascular: Dorsalis Pedis artery and Posterior Tibial artery pedal pulses are 2/4 bilateral with immedate capillary fill time. Pedal hair growth present. No varicosities and no lower extremity edema present bilateral.   Neruologic: Grossly intact via light touch bilateral. Vibratory intact via tuning fork bilateral. Protective threshold with Semmes Wienstein monofilament intact to all pedal sites bilateral. Patellar and Achilles deep tendon reflexes 2+ bilateral. No Babinski or clonus noted bilateral.   Musculoskeletal: No gross boney pedal deformities bilateral. No pain, crepitus, or limitation noted with foot and ankle range of motion bilateral. Muscular strength 5/5 in all groups tested bilateral.  Gait: Unassisted, Nonantalgic.    Radiographs:    Assessment & Plan:   Assessment:   Plan:      Lucienne Sawyers T. [place], [place]

## 2019-08-09 ENCOUNTER — Other Ambulatory Visit: Payer: Self-pay

## 2019-08-09 DIAGNOSIS — M722 Plantar fascial fibromatosis: Secondary | ICD-10-CM

## 2019-08-09 DIAGNOSIS — S86012A Strain of left Achilles tendon, initial encounter: Secondary | ICD-10-CM

## 2019-08-09 NOTE — Progress Notes (Unsigned)
Per Kearney Hard ([place]), MRI has been approved from 08/09/2019 to 11/07/2019 Approval # [facility]

## 2019-08-13 ENCOUNTER — Ambulatory Visit
Admission: RE | Admit: 2019-08-13 | Discharge: 2019-08-13 | Disposition: A | Discharge status: Home / Self Care | Payer: PRIVATE HEALTH INSURANCE | Source: Ambulatory Visit | Attending: Podiatry | Admitting: Podiatry

## 2019-08-13 ENCOUNTER — Other Ambulatory Visit: Payer: Self-pay

## 2019-08-13 ENCOUNTER — Telehealth: Payer: Self-pay | Admitting: *Deleted

## 2019-08-13 DIAGNOSIS — M722 Plantar fascial fibromatosis: Secondary | ICD-10-CM

## 2019-08-13 DIAGNOSIS — S86012A Strain of left Achilles tendon, initial encounter: Secondary | ICD-10-CM

## 2019-08-13 NOTE — Telephone Encounter (Signed)
Patient called stating she had MRI done and wanted results as soon as possible. Switching jobs in 2 weeks and if she needs surgery, she would like before the end of the year.

## 2019-08-13 NOTE — Telephone Encounter (Signed)
Called patient - let her know results were not in yet and we would be on the lookout for them and call her as soon as he reviews them.

## 2019-08-15 ENCOUNTER — Telehealth: Payer: Self-pay

## 2019-08-15 NOTE — Telephone Encounter (Signed)
Patient was informed of results and instructed to call back to schedule surgical consult with Dr. Milinda Pointer.

## 2019-08-15 NOTE — Telephone Encounter (Signed)
-----   Message from Andres Ege, RN sent at 08/13/2019  5:29 PM EST ----- [name], please assist pt. Anna Cross ----- Message ----- From: Garrel Ridgel, [place] Sent: 08/13/2019   5:00 PM EST To: Andres Ege, RN  Plantar fasciitis and lateral compensatory syndrome.Surgery consult.

## 2019-08-24 ENCOUNTER — Other Ambulatory Visit: Payer: PRIVATE HEALTH INSURANCE

## 2019-09-12 ENCOUNTER — Ambulatory Visit: Payer: PRIVATE HEALTH INSURANCE | Admitting: Podiatry

## 2020-07-12 IMAGING — MR MR ANKLE*L* W/O CM
5 of 6 series · 34 of 40 positions shown · non-contrast
Comparison: X-ray 01/16/2018

CLINICAL DATA: Lateral left ankle and heel pain

EXAM:
MRI OF THE LEFT ANKLE WITHOUT CONTRAST
TECHNIQUE: Multiplanar, multisequence MR imaging of the ankle was performed. No
intravenous contrast was administered.

[Series 3: T2 fat-sat · axial · 3.0mm · 0.62mm/px · z∈[-131,+9]mm · 8 of 40 slices shown (1 of 2)]
[im 1/40]
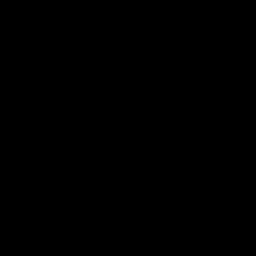
[im 6/40]
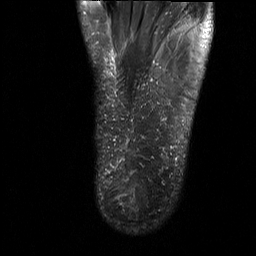
[im 12/40]
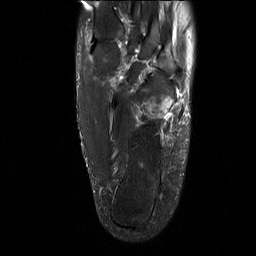
[im 17/40]
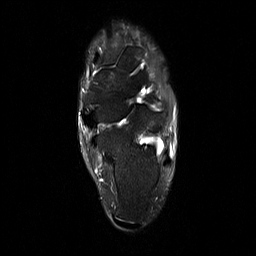
[im 23/40]
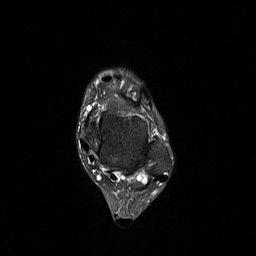
[im 28/40]
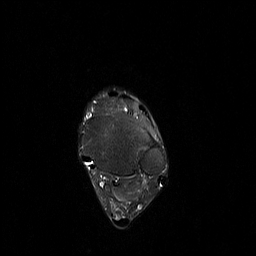
[im 34/40]
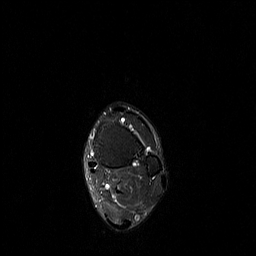
[im 40/40]
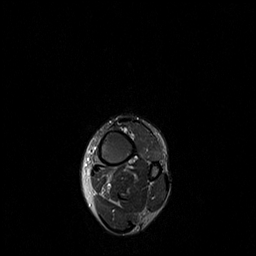

[Series 4: T1 · axial · 3.0mm · 0.50mm/px · z∈[-131,+9]mm · 8 of 40 slices shown (1 of 2)]
[im 1/40]
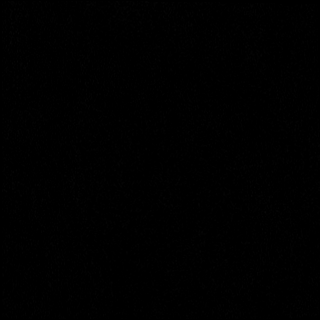
[im 6/40]
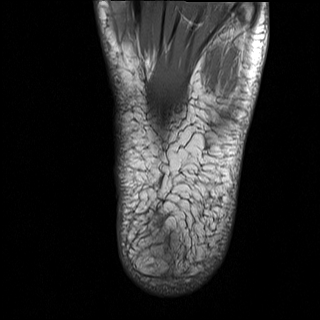
[im 12/40]
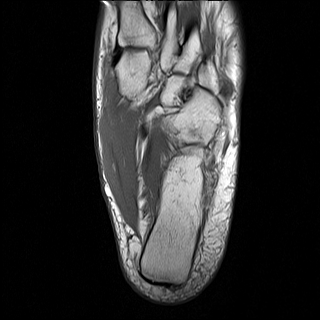
[im 17/40]
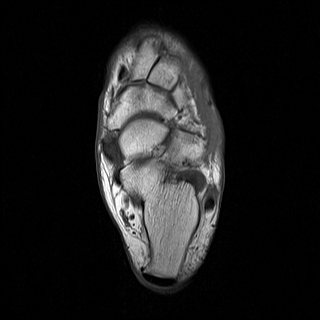
[im 23/40]
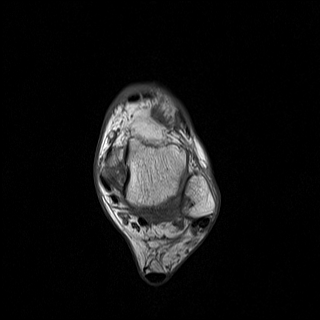
[im 28/40]
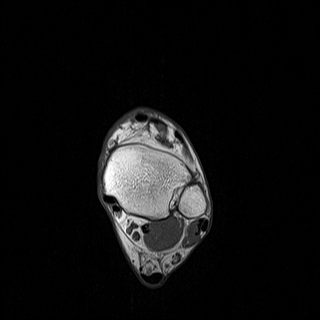
[im 34/40]
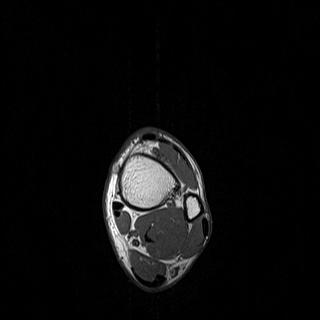
[im 40/40]
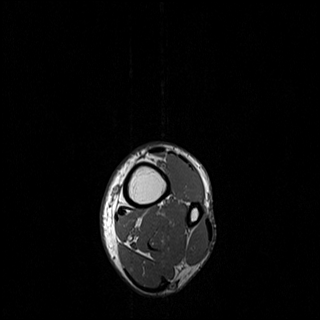

[Series 5: PD fat-sat · axial · 3.0mm · 0.62mm/px · z∈[-131,+9]mm · 7 of 40 slices shown]
[im 1/40]
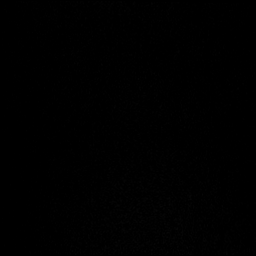
[im 7/40]
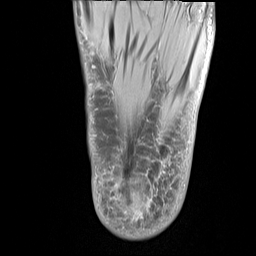
[im 14/40]
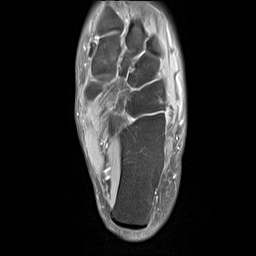
[im 20/40]
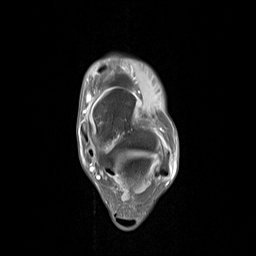
[im 27/40]
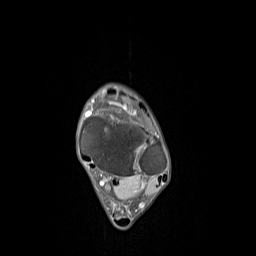
[im 33/40]
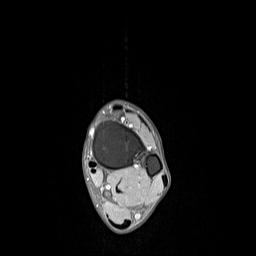
[im 40/40]
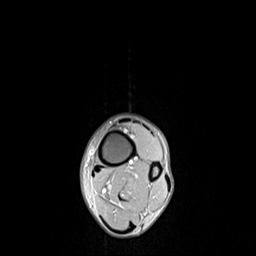

[Series 6: T1 · sagittal · 3.0mm · 0.31mm/px · 4 of 28 slices shown (2 of 2)]
[im 1/28]
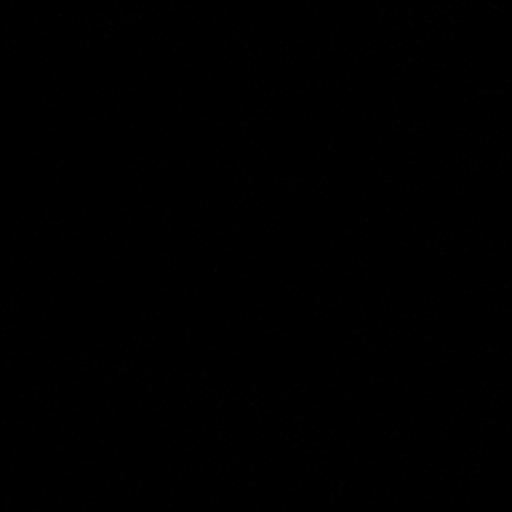
[im 7/28]
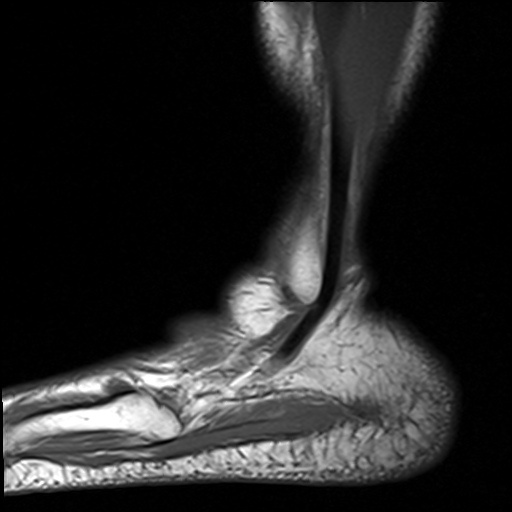
[im 14/28]
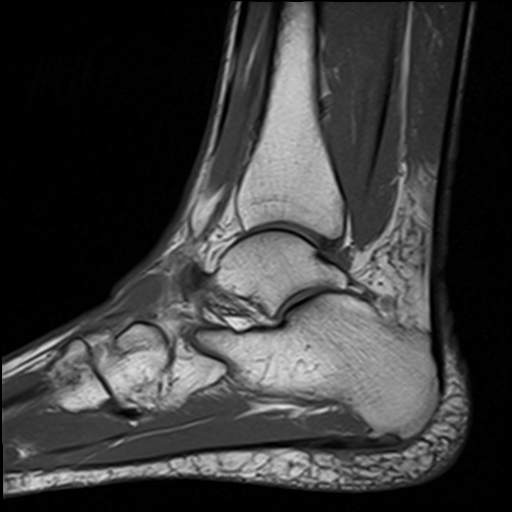
[im 21/28]
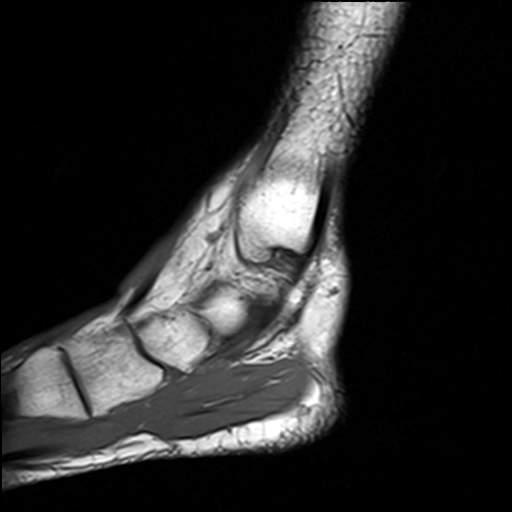

[Series 8: T2 fat-sat · coronal · 3.0mm · 0.31mm/px · 7 of 38 slices shown (2 of 2)]
[im 1/38]
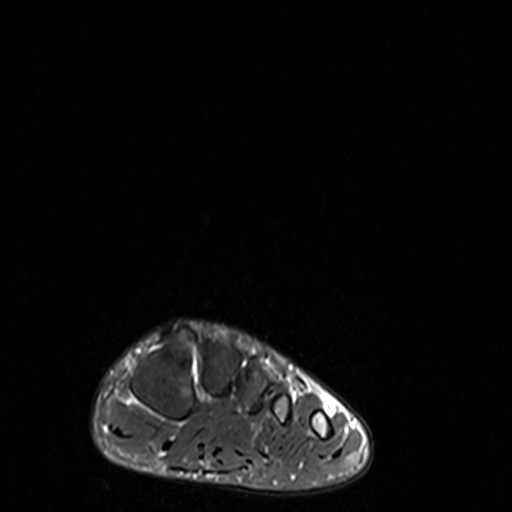
[im 7/38]
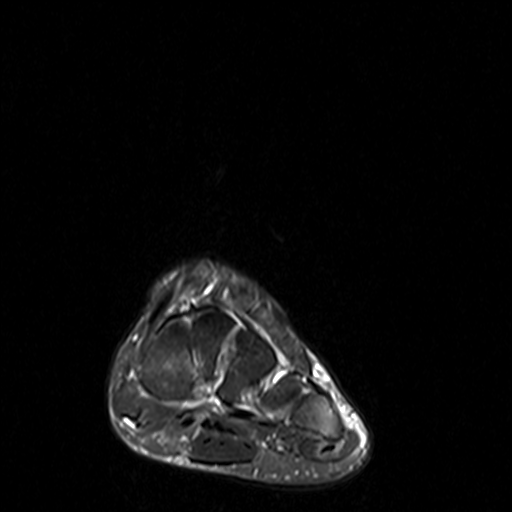
[im 13/38]
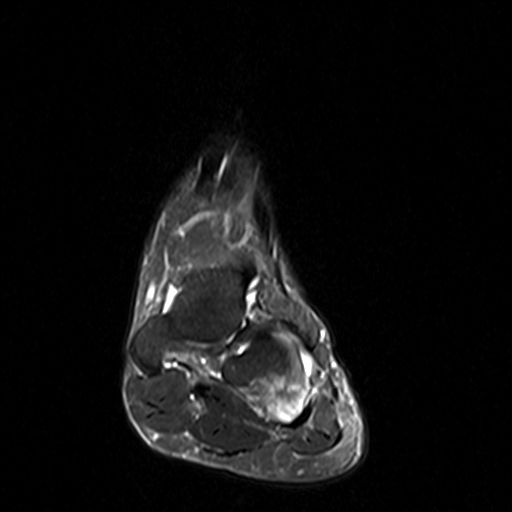
[im 19/38]
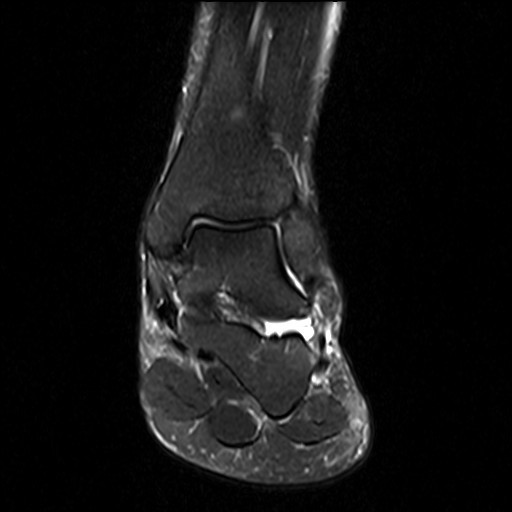
[im 25/38]
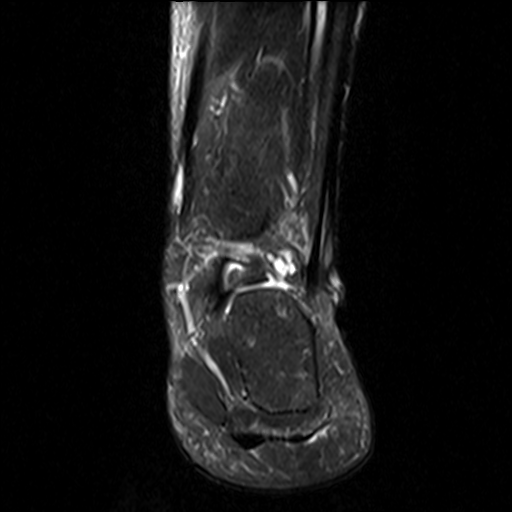
[im 31/38]
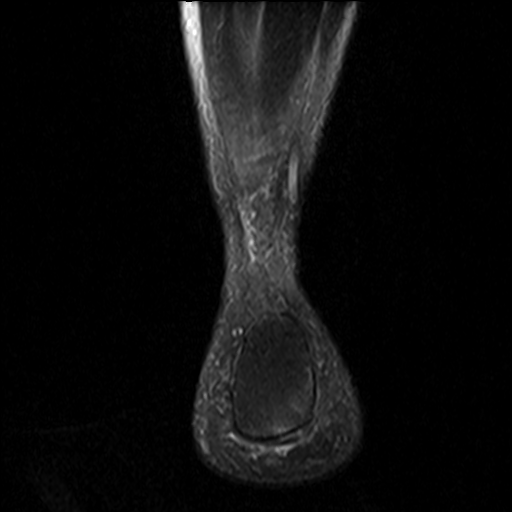
[im 38/38]
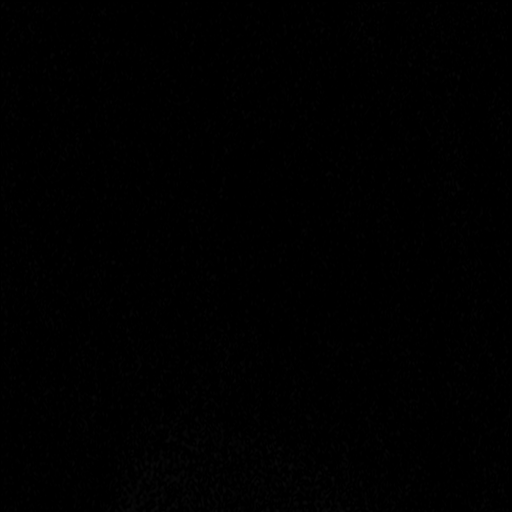

[34 of 40 positions shown; findings below may reference images not displayed]

FINDINGS: TENDONS

Peroneal: Intact peroneus longus and peroneus brevis tendons.

Posteromedial: Intact tibialis posterior, flexor hallucis longus and
flexor digitorum longus tendons.

Anterior: Intact tibialis anterior, extensor hallucis longus and
extensor digitorum longus tendons.

Achilles: Intact.

Plantar Fascia: Borderline thickening of the proximal plantar fascia
with mild surrounding perifascial edema and reactive marrow changes
within plantar calcaneal enthesophyte. No plantar fascial tear.

LIGAMENTS

Lateral: Intact.

Medial: Intact.

CARTILAGE

Ankle Joint: No joint effusion or chondral defect.

Subtalar Joints/Sinus Tarsi: Minimal subtalar joint space narrowing.
No joint effusion. Preservation of the anatomic fat within the sinus
tarsi.

Bones: There is prominent bone marrow edema within the proximal
cuboid (series 7, image 20). The edema is most pronounced along the
course of the traversing peroneus longus tendon. No discrete
fracture line. No significant arthropathy at the calcaneocuboid
joint.

Other: None.
IMPRESSION: 1. Findings of plantar fasciitis without plantar fascial tear.
2. Prominent bone marrow edema within the cuboid. These findings may
be reactive and/or reflect a cuboid pulley lesion given the
proximity to the traversing peroneus longus tendon, which can be a
source of lateral foot pain. No fracture.
3. Intact Achilles tendon.
